# Patient Record
Sex: Female | Born: 1940 | Race: White | Hispanic: No | Smoking: Never smoker
Health system: Southern US, Community
[De-identification: ages and names within clinical notes are randomized; demographics above are authoritative.]

## PROBLEM LIST (undated history)

## (undated) DIAGNOSIS — D510 Vitamin B12 deficiency anemia due to intrinsic factor deficiency: Secondary | ICD-10-CM

## (undated) DIAGNOSIS — I10 Essential (primary) hypertension: Secondary | ICD-10-CM

## (undated) DIAGNOSIS — E079 Disorder of thyroid, unspecified: Secondary | ICD-10-CM

## (undated) DIAGNOSIS — E785 Hyperlipidemia, unspecified: Secondary | ICD-10-CM

## (undated) DIAGNOSIS — R55 Syncope and collapse: Secondary | ICD-10-CM

## (undated) DIAGNOSIS — N390 Urinary tract infection, site not specified: Secondary | ICD-10-CM

## (undated) DIAGNOSIS — I4891 Unspecified atrial fibrillation: Secondary | ICD-10-CM

## (undated) DIAGNOSIS — F419 Anxiety disorder, unspecified: Secondary | ICD-10-CM

## (undated) DIAGNOSIS — M797 Fibromyalgia: Secondary | ICD-10-CM

## (undated) DIAGNOSIS — H409 Unspecified glaucoma: Secondary | ICD-10-CM

## (undated) DIAGNOSIS — R0602 Shortness of breath: Secondary | ICD-10-CM

## (undated) DIAGNOSIS — G5600 Carpal tunnel syndrome, unspecified upper limb: Secondary | ICD-10-CM

## (undated) DIAGNOSIS — E860 Dehydration: Secondary | ICD-10-CM

## (undated) DIAGNOSIS — Z789 Other specified health status: Secondary | ICD-10-CM

## (undated) DIAGNOSIS — I639 Cerebral infarction, unspecified: Secondary | ICD-10-CM

## (undated) DIAGNOSIS — D649 Anemia, unspecified: Secondary | ICD-10-CM

## (undated) DIAGNOSIS — E119 Type 2 diabetes mellitus without complications: Secondary | ICD-10-CM

## (undated) HISTORY — DX: Dehydration: E86.0

## (undated) HISTORY — DX: Unspecified glaucoma: H40.9

## (undated) HISTORY — DX: Anemia, unspecified: D64.9

## (undated) HISTORY — DX: Carpal tunnel syndrome, unspecified upper limb: G56.00

## (undated) HISTORY — DX: Anxiety disorder, unspecified: F41.9

## (undated) HISTORY — DX: Fibromyalgia: M79.7

## (undated) HISTORY — DX: Syncope and collapse: R55

## (undated) HISTORY — DX: Type 2 diabetes mellitus without complications: E11.9

## (undated) HISTORY — DX: Cerebral infarction, unspecified: I63.9

## (undated) HISTORY — DX: Vitamin B12 deficiency anemia due to intrinsic factor deficiency: D51.0

## (undated) HISTORY — DX: Disorder of thyroid, unspecified: E07.9

## (undated) HISTORY — DX: Essential (primary) hypertension: I10

## (undated) HISTORY — DX: Hyperlipidemia, unspecified: E78.5

## (undated) HISTORY — PX: NEUROPLASTY / TRANSPOSITION MEDIAN NERVE AT CARPAL TUNNEL: SUR893

## (undated) HISTORY — DX: Urinary tract infection, site not specified: N39.0

## (undated) HISTORY — DX: Shortness of breath: R06.02

## (undated) HISTORY — PX: APPENDECTOMY: SHX54

## (undated) HISTORY — DX: Unspecified atrial fibrillation: I48.91

---

## 1999-03-06 ENCOUNTER — Inpatient Hospital Stay (HOSPITAL_COMMUNITY)
Admission: RE | Admit: 1999-03-06 | Discharge: 1999-04-03 | Payer: Self-pay | Admitting: Physical Medicine & Rehabilitation

## 1999-05-09 ENCOUNTER — Emergency Department (HOSPITAL_COMMUNITY): Admission: EM | Admit: 1999-05-09 | Discharge: 1999-05-09 | Payer: Self-pay | Admitting: Emergency Medicine

## 2013-08-17 ENCOUNTER — Ambulatory Visit: Payer: Self-pay | Admitting: Podiatrist

## 2013-09-02 ENCOUNTER — Ambulatory Visit (INDEPENDENT_AMBULATORY_CARE_PROVIDER_SITE_OTHER): Payer: Medicare HMO

## 2013-09-02 VITALS — BP 140/70 | HR 75 | Resp 18

## 2013-09-02 DIAGNOSIS — M79609 Pain in unspecified limb: Secondary | ICD-10-CM

## 2013-09-02 DIAGNOSIS — B351 Tinea unguium: Secondary | ICD-10-CM

## 2013-09-02 DIAGNOSIS — L600 Ingrowing nail: Secondary | ICD-10-CM

## 2013-09-02 MED ORDER — CEPHALEXIN 500 MG PO CAPS: 500.0000 mg | ORAL_CAPSULE | Freq: Three times a day (TID) | ORAL | Status: DC

## 2013-09-02 NOTE — Patient Instructions (Signed)
Betadine Soak Instructions  Purchase an 8 oz. bottle of BETADINE solution (Povidone)  THE DAY AFTER THE PROCEDURE  Place 1 tablespoon of betadine solution in a quart of warm tap water.  Submerge your foot or feet with outer bandage intact for the initial soak; this will allow the bandage to become moist and wet for easy lift off.  Once you remove your bandage, continue to soak in the solution for 20 minutes.  This soak should be done twice a day.  Next, remove your foot or feet from solution, blot dry the affected area and cover.  You may use a band aid large enough to cover the area or use gauze and tape.  Apply other medications to the area as directed by the doctor such as cortisporin otic solution (ear drops) or neosporin.  IF YOUR SKIN BECOMES IRRITATED WHILE USING THESE INSTRUCTIONS, IT IS OKAY TO SWITCH TO EPSOM SALTS AND WATER OR WHITE VINEGAR AND WATER. Soak the great toe twice a week for the first week in them once we thereafter until the wound is resolved. 2 Betadine warm water soaks however Betadine caused an irritation may switch to Epsom salts in warm water soaks daily redress with Neosporin or Polysporin and a Band-Aid or gauze dressing.  Alvan Dameichard Syriana Croslin DPM

## 2013-09-02 NOTE — Progress Notes (Signed)
   Subjective:    Patient ID: Diana Brady, female    DOB: [birth year], 73 y.o.   MRN: [id number]  HPI my left big toenail has been thick and curling in and has been that way for years and sore and tender and hurts to wear shoes and the podiatrist that comes to the faculty trimmed on it and part of the nail came out and burns and throbs and my feet get hot and cold and some tingling    Review of Systems  Endocrine: Positive for heat intolerance.  Musculoskeletal: Positive for back pain.  Neurological: Positive for dizziness and headaches.  Hematological: Bruises/bleeds easily.  All other systems reviewed and are negative.      Objective:   Physical Exam Lower extremity objective findings as follows after status is intact with pedal pulses palpable DP plus one over 4 PT plus one over 4 bilateral capillary refill time 3 seconds all digits epicritic and proprioceptive sensations are intact although diminished on Semmes Weinstein testing to the digits and plantar forefoot arch. There is normal plantar response DTRs not listed dermatologic the skin color pigment normal hair growth absent nails criptotic incurvated friable left hallux nail shows yellowing and thickening discoloration and brittleness in kyphosis consistent with onychomycosis and dystrophy of nail possible history of nail injury trauma in the past to there is no discharge drainage no open wounds ulcerations. Orthopedic biomechanical exam rectus foot type ankle mid tarsus subtalar joint motions normal there is slight digital contractures hammertoes 34 and 5 mild bunion deformity noted as well to       Assessment & Plan:  Assessment this time is onychomycosis painful mycotic nail ingrowing thickened dystrophic and brittle one first left at this time the nails been painful and symptomatic for prolonged period of time has had previous temporary debridements hour per patient request my recommendation this time is for nail excision and  phenol matricectomy local anesthetic block is administered total of 3 cc 50-50 mixture of 2% Xylocaine plain and 0.5% Marcaine plain Betadine prep performed the nail plate is avulsed phenol matricectomy followed by Betadine ointment and dry sterile dressing applied to the right great toe patient is given instructions for daily Betadine warm water soaks prescription for cephalexin 500 mg 3 times a day x10 days maintain dressing changes and soaks as instructed with Neosporin and Band-Aid dressing starting tomorrow and DT cannot twice daily for the first week once daily thereafter reappointed 2 weeks for followup and nail check recommended Tylenol as needed for pain  Alvan Dameichard Jaxsen Bernhart DPM

## 2017-01-02 DIAGNOSIS — Z01818 Encounter for other preprocedural examination: Secondary | ICD-10-CM

## 2017-01-24 ENCOUNTER — Ambulatory Visit (INDEPENDENT_AMBULATORY_CARE_PROVIDER_SITE_OTHER): Payer: Medicare HMO | Admitting: Cardiology

## 2017-01-24 ENCOUNTER — Encounter: Payer: Self-pay | Admitting: Cardiology

## 2017-01-24 VITALS — BP 112/78 | HR 60 | Resp 14 | Ht 62.0 in | Wt 206.0 lb

## 2017-01-24 DIAGNOSIS — E785 Hyperlipidemia, unspecified: Secondary | ICD-10-CM | POA: Diagnosis not present

## 2017-01-24 DIAGNOSIS — I482 Chronic atrial fibrillation, unspecified: Secondary | ICD-10-CM | POA: Insufficient documentation

## 2017-01-24 DIAGNOSIS — I6990 Unspecified sequelae of unspecified cerebrovascular disease: Secondary | ICD-10-CM | POA: Diagnosis not present

## 2017-01-24 DIAGNOSIS — Z01818 Encounter for other preprocedural examination: Secondary | ICD-10-CM | POA: Diagnosis not present

## 2017-01-24 DIAGNOSIS — E118 Type 2 diabetes mellitus with unspecified complications: Secondary | ICD-10-CM

## 2017-01-24 NOTE — Progress Notes (Signed)
Cardiology Office Note:    Date:  01/24/2017   ID:  Diana Brady, DOB 1941/04/15, MRN [id number]  PCP:  Shelbie Ammons, MD  Cardiologist:  Gypsy Balsam, MD    Referring MD: Shelbie Ammons, MD   Chief Complaint  Patient presents with  . Follow-up  . Pre-op Exam  she is doingwell from a heart point of view but cannot exercise because of knee problem  History of Present Illness:    Diana Brady is a 76 y.o. female with multiple medical problems, she is here to be evaluated before surgery. Denies having any chest pain tightness squeezing pressure burning in the chest but does have multiple risk factors for coronary artery disease therefore, in spite of the fact that her surgeriesrelatively low risk she needs to be evaluated before the surgery. She will have echocardiogram done as well as stress test. I will ask her to have EKG as well.  Past Medical History:  Diagnosis Date  . Anemia   . Anxiety   . Atrial fibrillation (HCC)   . Carpal tunnel syndrome   . CVA (cerebral infarction)   . Dehydration   . Diabetes mellitus without complication (HCC)   . Fibromyalgia   . Glaucoma   . Hyperlipidemia   . Hypertension   . Pernicious anemia   . Shortness of breath   . Syncope   . Thyroid disease   . UTI (urinary tract infection)     Past Surgical History:  Procedure Laterality Date  . APPENDECTOMY    . NEUROPLASTY / TRANSPOSITION MEDIAN NERVE AT CARPAL TUNNEL      Current Medications: Current Meds  Medication Sig  . atorvastatin (LIPITOR) 40 MG tablet Take 40 mg by mouth daily.  . benazepril (LOTENSIN) 10 MG tablet Take 10 mg by mouth daily.  . budesonide-formoterol (SYMBICORT) 160-4.5 MCG/ACT inhaler Inhale 2 puffs into the lungs 2 (two) times daily.  . cloNIDine (CATAPRES) 0.1 MG tablet Take 0.1 mg by mouth 2 (two) times daily.  . dabigatran (PRADAXA) 75 MG CAPS capsule Take 75 mg by mouth 2 (two) times daily.  . furosemide (LASIX) 20 MG tablet Take 20 mg by  mouth daily.  Marland Kitchen glimepiride (AMARYL) 4 MG tablet Take 4 mg by mouth 2 (two) times daily.  . insulin glargine (LANTUS) 100 UNIT/ML injection Inject into the skin at bedtime.  . insulin NPH Human (HUMULIN N,NOVOLIN N) 100 UNIT/ML injection Inject into the skin.  Marland Kitchen ipratropium-albuterol (DUONEB) 0.5-2.5 (3) MG/3ML SOLN Take 3 mLs by nebulization every 6 (six) hours as needed.  Marland Kitchen levothyroxine (SYNTHROID, LEVOTHROID) 25 MCG tablet Take 25 mcg by mouth daily before breakfast.  . metoprolol succinate (TOPROL-XL) 50 MG 24 hr tablet Take 50 mg by mouth daily. Take with or immediately following a meal.  . montelukast (SINGULAIR) 10 MG tablet Take 10 mg by mouth at bedtime.  Marland Kitchen olopatadine (PATANOL) 0.1 % ophthalmic solution Place 1 drop into both eyes 2 (two) times daily.  . pantoprazole (PROTONIX) 40 MG tablet Take 40 mg by mouth daily.  Marland Kitchen PARoxetine (PAXIL) 40 MG tablet Take 40 mg by mouth every morning.  Marland Kitchen scopolamine (ISOPTO HYOSCINE) 0.25 % ophthalmic solution Place 1 drop into both eyes as needed.  . Tiotropium Bromide Monohydrate (SPIRIVA RESPIMAT IN) Inhale into the lungs.     Allergies:   Codeine; Darvon [propoxyphene]; Dilaudid [hydromorphone hcl]; Propoxyphene; Sulfa antibiotics; Tramadol; and Vancomycin   Social History   Social History  . Marital status: Divorced  Spouse name: N/A  . Number of children: N/A  . Years of education: N/A   Social History Main Topics  . Smoking status: Never Smoker  . Smokeless tobacco: Never Used  . Alcohol use No  . Drug use: No  . Sexual activity: Not Asked   Other Topics Concern  . None   Social History Narrative  . None     Family History: The patient's family history includes Heart disease in her mother. ROS:   Please see the history of present illness.    All 14 point review of systems negative except as described per history of present illness  EKGs/Labs/Other Studies Reviewed:      Recent Labs: No results found for requested  labs within last 8760 hours.  Recent Lipid Panel No results found for: CHOL, TRIG, HDL, CHOLHDL, VLDL, LDLCALC, LDLDIRECT  Physical Exam:    VS:  BP 112/78   Pulse 60   Resp 14   Ht  (1.575 m)   Wt 206 lb (93.4 kg) Comment: Reported, Wheel Chair  BMI 37.68 kg/m     Wt Readings from Last 3 Encounters:  01/24/17 206 lb (93.4 kg)     GEN:  Well nourished, well developed in no acute distress HEENT: Normal NECK: No JVD; No carotid bruits LYMPHATICS: No lymphadenopathy CARDIAC: irregularly irregular, no murmurs, no rubs, no gallops RESPIRATORY:  Clear to auscultation without rales, wheezing or rhonchi  ABDOMEN: Soft, non-tender, non-distended MUSCULOSKELETAL:  No edema; No deformity  SKIN: Warm and dry LOWER EXTREMITIES: no swelling NEUROLOGIC:  Alert and oriented x 3 PSYCHIATRIC:  Normal affect   ASSESSMENT:    1. Chronic atrial fibrillation (HCC)   2. Dyslipidemia   3. Type 2 diabetes mellitus with complication, without long-term current use of insulin (HCC)   4. Late effects of CVA (cerebrovascular accident)    PLAN:    In order of problems listed above:  1. Chronic atrial fibrillation: Rate control, continue anticoagulation. 2. Dyslipidemia:On statin which I will continue. 3. Pre-op evaluation for knee replacement surgery under general or spinal anesthesia I will ask her to have stress test as well as echocardiogram as a part of evaluation. Her ability to exercise is very limited therefore dose tests are needed. 4. Late effects of CVA: Stable   Medication Adjustments/Labs and Tests Ordered: Current medicines are reviewed at length with the patient today.  Concerns regarding medicines are outlined above.  No orders of the defined types were placed in this encounter.  Medication changes: No orders of the defined types were placed in this encounter.   Signed, Georgeanna Lea, MD, [facility] 01/24/2017 4:04 PM    [facility]

## 2017-01-24 NOTE — Patient Instructions (Addendum)
Medication Instructions:  Your physician recommends that you continue on your current medications as directed. Please refer to the Current Medication list given to you today.  Labwork: None   Testing/Procedures: Your physician has requested that you have en exercise stress myoview. For further information please visit [email or web address] Please follow instruction sheet, as given.  Your physician has requested that you have an echocardiogram. Echocardiography is a painless test that uses sound waves to create images of your heart. It provides your doctor with information about the size and shape of your heart and how well your heart's chambers and valves are working. This procedure takes approximately one hour. There are no restrictions for this procedure.  Echocardiogram An echocardiogram, or echocardiography, uses sound waves (ultrasound) to produce an image of your heart. The echocardiogram is simple, painless, obtained within a short period of time, and offers valuable information to your health care provider. The images from an echocardiogram can provide information such as:  Evidence of coronary artery disease (CAD).  Heart size.  Heart muscle function.  Heart valve function.  Aneurysm detection.  Evidence of a past heart attack.  Fluid buildup around the heart.  Heart muscle thickening.  Assess heart valve function.  Tell a health care provider about:  Any allergies you have.  All medicines you are taking, including vitamins, herbs, eye drops, creams, and over-the-counter medicines.  Any problems you or family members have had with anesthetic medicines.  Any blood disorders you have.  Any surgeries you have had.  Any medical conditions you have.  Whether you are pregnant or may be pregnant. What happens before the procedure? No special preparation is needed. Eat and drink normally. What happens during the procedure?  In order to produce an image of your  heart, gel will be applied to your chest and a wand-like tool (transducer) will be moved over your chest. The gel will help transmit the sound waves from the transducer. The sound waves will harmlessly bounce off your heart to allow the heart images to be captured in real-time motion. These images will then be recorded.  You may need an IV to receive a medicine that improves the quality of the pictures. What happens after the procedure? You may return to your normal schedule including diet, activities, and medicines, unless your health care provider tells you otherwise. This information is not intended to replace advice given to you by your health care provider. Make sure you discuss any questions you have with your health care provider. Document Released: 02/25/2000 Document Revised: 12/09/2015 Document Reviewed: 12/28/2012 Elsevier Interactive Patient Education  2017 Elsevier Inc.  Regadenoson injection What is this medicine? REGADENOSON is used to test the heart for coronary artery disease. It is used in patients who can not exercise for their stress test. This medicine may be used for other purposes; ask your health care provider or pharmacist if you have questions. COMMON BRAND NAME(S): Lexiscan What should I tell my health care provider before I take this medicine? They need to know if you have any of these conditions: -heart problems -lung or breathing disease, like asthma or COPD -an unusual or allergic reaction to regadenoson, other medicines, foods, dyes, or preservatives -pregnant or trying to get pregnant -breast-feeding How should I use this medicine? This medicine is for injection into a vein. It is given by a health care professional in a hospital or clinic setting. Talk to your pediatrician regarding the use of this medicine in children. Special care may be needed.  Overdosage: If you think you have taken too much of this medicine contact a poison control center or emergency  room at once. NOTE: This medicine is only for you. Do not share this medicine with others. What if I miss a dose? This does not apply. What may interact with this medicine? -caffeine -dipyridamole -guarana -theophylline This list may not describe all possible interactions. Give your health care provider a list of all the medicines, herbs, non-prescription drugs, or dietary supplements you use. Also tell them if you smoke, drink alcohol, or use illegal drugs. Some items may interact with your medicine. What should I watch for while using this medicine? Your condition will be monitored carefully while you are receiving this medicine. Do not take medicines, foods, or drinks with caffeine (like coffee, tea, or colas) for at least 12 hours before your test. If you do not know if something contains caffeine, ask your health care professional. What side effects may I notice from receiving this medicine? Side effects that you should report to your doctor or health care professional as soon as possible: -allergic reactions like skin rash, itching or hives, swelling of the face, lips, or tongue -breathing problems -chest pain, tightness or palpitations -severe headache Side effects that usually do not require medical attention (report to your doctor or health care professional if they continue or are bothersome): -flushing -headache -irritation or pain at site where injected -nausea, vomiting This list may not describe all possible side effects. Call your doctor for medical advice about side effects. You may report side effects to FDA at 1-800-FDA-1088. Where should I keep my medicine? This drug is given in a hospital or clinic and will not be stored at home. NOTE: This sheet is a summary. It may not cover all possible information. If you have questions about this medicine, talk to your doctor, pharmacist, or health care provider.  2018 Elsevier/Gold Standard (2007-12-21  15:08:13)    Follow-Up: Your physician wants you to follow-up in: 6 months.  You will receive a reminder letter in the mail two months in advance. If you don't receive a letter, please call our office to schedule the follow-up appointment.  Any Other Special Instructions Will Be Listed Below (If Applicable).  Please note that any paperwork needing to be filled out by the provider will need to be addressed at the front desk prior to seeing the provider. Please note that any paperwork FMLA, Disability or other documents regarding health condition is subject to a $25.00 charge that must be received prior to completion of paperwork in the form of a money order or check.    If you need a refill on your cardiac medications before your next appointment, please call your pharmacy.

## 2017-02-04 ENCOUNTER — Telehealth (HOSPITAL_COMMUNITY): Payer: Self-pay | Admitting: *Deleted

## 2017-02-04 NOTE — Telephone Encounter (Signed)
Attempted to call patient regarding upcoming appointment- no answer.  Diana Brady Jacqueline  

## 2017-02-06 ENCOUNTER — Ambulatory Visit (HOSPITAL_COMMUNITY): Payer: Medicare HMO | Attending: Cardiology

## 2017-02-06 ENCOUNTER — Other Ambulatory Visit: Payer: Self-pay

## 2017-02-06 ENCOUNTER — Ambulatory Visit (HOSPITAL_BASED_OUTPATIENT_CLINIC_OR_DEPARTMENT_OTHER): Payer: Medicare HMO

## 2017-02-06 DIAGNOSIS — Z01818 Encounter for other preprocedural examination: Secondary | ICD-10-CM | POA: Diagnosis not present

## 2017-02-06 DIAGNOSIS — I482 Chronic atrial fibrillation, unspecified: Secondary | ICD-10-CM

## 2017-02-06 DIAGNOSIS — D649 Anemia, unspecified: Secondary | ICD-10-CM | POA: Diagnosis not present

## 2017-02-06 DIAGNOSIS — R0609 Other forms of dyspnea: Secondary | ICD-10-CM | POA: Insufficient documentation

## 2017-02-06 DIAGNOSIS — Z87440 Personal history of urinary (tract) infections: Secondary | ICD-10-CM | POA: Diagnosis not present

## 2017-02-06 DIAGNOSIS — Z8673 Personal history of transient ischemic attack (TIA), and cerebral infarction without residual deficits: Secondary | ICD-10-CM | POA: Diagnosis not present

## 2017-02-06 DIAGNOSIS — F419 Anxiety disorder, unspecified: Secondary | ICD-10-CM | POA: Insufficient documentation

## 2017-02-06 DIAGNOSIS — E785 Hyperlipidemia, unspecified: Secondary | ICD-10-CM | POA: Diagnosis not present

## 2017-02-06 DIAGNOSIS — E119 Type 2 diabetes mellitus without complications: Secondary | ICD-10-CM | POA: Insufficient documentation

## 2017-02-06 DIAGNOSIS — E079 Disorder of thyroid, unspecified: Secondary | ICD-10-CM | POA: Insufficient documentation

## 2017-02-06 DIAGNOSIS — I10 Essential (primary) hypertension: Secondary | ICD-10-CM | POA: Diagnosis not present

## 2017-02-06 DIAGNOSIS — I081 Rheumatic disorders of both mitral and tricuspid valves: Secondary | ICD-10-CM | POA: Diagnosis not present

## 2017-02-06 LAB — MYOCARDIAL PERFUSION IMAGING
CHL CUP NUCLEAR SDS: 0
CHL CUP RESTING HR STRESS: 81 {beats}/min
LHR: 0.31
LVDIAVOL: 80 mL (ref 46–106)
LVSYSVOL: 29 mL
NUC STRESS TID: 0.96
Peak HR: 98 {beats}/min
SRS: 2
SSS: 2

## 2017-02-06 LAB — ECHOCARDIOGRAM COMPLETE
HEIGHTINCHES: 62 in
WEIGHTICAEL: 3296 [oz_av]

## 2017-02-06 MED ORDER — PERFLUTREN LIPID MICROSPHERE
1.0000 mL | INTRAVENOUS | Status: AC | PRN
  Administered 2017-02-06: 2 mL via INTRAVENOUS

## 2017-02-06 MED ORDER — TECHNETIUM TC 99M TETROFOSMIN IV KIT
10.2000 | PACK | Freq: Once | INTRAVENOUS | Status: AC | PRN
  Administered 2017-02-06: 10.2 via INTRAVENOUS
  Filled 2017-02-06: qty 11

## 2017-02-06 MED ORDER — REGADENOSON 0.4 MG/5ML IV SOLN
0.4000 mg | Freq: Once | INTRAVENOUS | Status: AC
  Administered 2017-02-06: 0.4 mg via INTRAVENOUS

## 2017-02-06 MED ORDER — TECHNETIUM TC 99M TETROFOSMIN IV KIT
31.5000 | PACK | Freq: Once | INTRAVENOUS | Status: AC | PRN
  Administered 2017-02-06: 31.5 via INTRAVENOUS
  Filled 2017-02-06: qty 32

## 2018-06-07 IMAGING — NM NM MISC PROCEDURE
6 series · 36 of 36 positions shown · non-contrast
Comparison: none

[Series 1: wbr_r-proj_st rest · 6.40mm/px · 6 of 64 frames shown]
[frame 6/64]
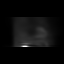
[frame 16/64]
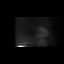
[frame 27/64]
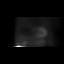
[frame 38/64]
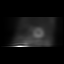
[frame 48/64]
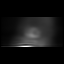
[frame 59/64]
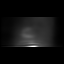

[Series 1: rest · 6.40mm/px · 6 of 64 frames shown]
[frame 6/64]
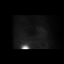
[frame 16/64]
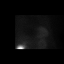
[frame 27/64]
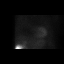
[frame 38/64]
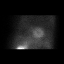
[frame 48/64]
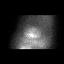
[frame 59/64]
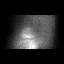

[Series 1: wbr_s-proj_st stress-sum-em · 6.40mm/px · 6 of 64 frames shown]
[frame 6/64]
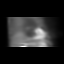
[frame 16/64]
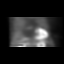
[frame 27/64]
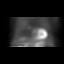
[frame 38/64]
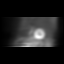
[frame 48/64]
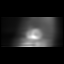
[frame 59/64]
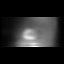

[Series 1: stress-gsp · 6.40mm/px · 6 of 510 frames shown]
[frame 43/510]
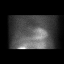
[frame 128/510]
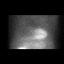
[frame 213/510]
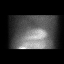
[frame 298/510]
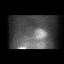
[frame 383/510]
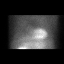
[frame 468/510]
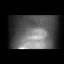

[Series 1: wbr_s-proj_st stress-gsp · 6.40mm/px · 6 of 512 frames shown]
[frame 43/512]
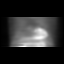
[frame 128/512]
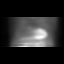
[frame 214/512]
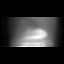
[frame 299/512]
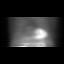
[frame 384/512]
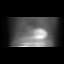
[frame 470/512]
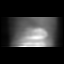

[Series 1: stress-sum-em · 6.40mm/px · 6 of 64 frames shown]
[frame 6/64]
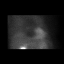
[frame 16/64]
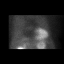
[frame 27/64]
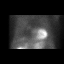
[frame 38/64]
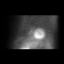
[frame 48/64]
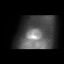
[frame 59/64]
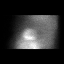

[36 of 36 positions shown; findings below may reference images not displayed]

Canned report from images found in remote index.

Refer to host system for actual result text.

## 2018-06-12 DIAGNOSIS — I110 Hypertensive heart disease with heart failure: Secondary | ICD-10-CM

## 2018-06-12 DIAGNOSIS — J9601 Acute respiratory failure with hypoxia: Secondary | ICD-10-CM

## 2018-06-12 DIAGNOSIS — R0602 Shortness of breath: Secondary | ICD-10-CM

## 2018-06-12 DIAGNOSIS — I482 Chronic atrial fibrillation, unspecified: Secondary | ICD-10-CM

## 2018-06-12 DIAGNOSIS — I5033 Acute on chronic diastolic (congestive) heart failure: Secondary | ICD-10-CM

## 2018-06-13 DIAGNOSIS — I361 Nonrheumatic tricuspid (valve) insufficiency: Secondary | ICD-10-CM

## 2018-06-13 DIAGNOSIS — J9601 Acute respiratory failure with hypoxia: Secondary | ICD-10-CM | POA: Diagnosis not present

## 2018-06-13 DIAGNOSIS — I272 Pulmonary hypertension, unspecified: Secondary | ICD-10-CM

## 2018-06-13 DIAGNOSIS — I340 Nonrheumatic mitral (valve) insufficiency: Secondary | ICD-10-CM | POA: Diagnosis not present

## 2018-06-13 DIAGNOSIS — R0602 Shortness of breath: Secondary | ICD-10-CM | POA: Diagnosis not present

## 2018-06-13 DIAGNOSIS — I110 Hypertensive heart disease with heart failure: Secondary | ICD-10-CM | POA: Diagnosis not present

## 2018-06-13 DIAGNOSIS — I482 Chronic atrial fibrillation, unspecified: Secondary | ICD-10-CM | POA: Diagnosis not present

## 2018-06-14 DIAGNOSIS — J9601 Acute respiratory failure with hypoxia: Secondary | ICD-10-CM | POA: Diagnosis not present

## 2018-06-14 DIAGNOSIS — R0602 Shortness of breath: Secondary | ICD-10-CM | POA: Diagnosis not present

## 2018-06-14 DIAGNOSIS — I110 Hypertensive heart disease with heart failure: Secondary | ICD-10-CM | POA: Diagnosis not present

## 2018-06-14 DIAGNOSIS — I482 Chronic atrial fibrillation, unspecified: Secondary | ICD-10-CM | POA: Diagnosis not present

## 2018-06-15 DIAGNOSIS — I482 Chronic atrial fibrillation, unspecified: Secondary | ICD-10-CM | POA: Diagnosis not present

## 2018-06-15 DIAGNOSIS — J9601 Acute respiratory failure with hypoxia: Secondary | ICD-10-CM | POA: Diagnosis not present

## 2018-06-15 DIAGNOSIS — I110 Hypertensive heart disease with heart failure: Secondary | ICD-10-CM | POA: Diagnosis not present

## 2018-06-15 DIAGNOSIS — R0602 Shortness of breath: Secondary | ICD-10-CM | POA: Diagnosis not present

## 2019-01-29 DIAGNOSIS — R0902 Hypoxemia: Secondary | ICD-10-CM

## 2019-01-29 DIAGNOSIS — I10 Essential (primary) hypertension: Secondary | ICD-10-CM

## 2019-01-29 DIAGNOSIS — K219 Gastro-esophageal reflux disease without esophagitis: Secondary | ICD-10-CM

## 2019-01-29 DIAGNOSIS — E669 Obesity, unspecified: Secondary | ICD-10-CM

## 2019-01-29 DIAGNOSIS — I509 Heart failure, unspecified: Secondary | ICD-10-CM

## 2019-01-29 DIAGNOSIS — N179 Acute kidney failure, unspecified: Secondary | ICD-10-CM

## 2019-01-29 DIAGNOSIS — E039 Hypothyroidism, unspecified: Secondary | ICD-10-CM

## 2019-01-29 DIAGNOSIS — I4891 Unspecified atrial fibrillation: Secondary | ICD-10-CM

## 2019-01-29 DIAGNOSIS — R0602 Shortness of breath: Secondary | ICD-10-CM | POA: Diagnosis not present

## 2019-01-29 DIAGNOSIS — J441 Chronic obstructive pulmonary disease with (acute) exacerbation: Secondary | ICD-10-CM

## 2019-01-29 DIAGNOSIS — R739 Hyperglycemia, unspecified: Secondary | ICD-10-CM

## 2019-01-30 DIAGNOSIS — R0902 Hypoxemia: Secondary | ICD-10-CM | POA: Diagnosis not present

## 2019-01-30 DIAGNOSIS — I340 Nonrheumatic mitral (valve) insufficiency: Secondary | ICD-10-CM

## 2019-01-30 DIAGNOSIS — I4891 Unspecified atrial fibrillation: Secondary | ICD-10-CM | POA: Diagnosis not present

## 2019-01-30 DIAGNOSIS — J441 Chronic obstructive pulmonary disease with (acute) exacerbation: Secondary | ICD-10-CM | POA: Diagnosis not present

## 2019-01-30 DIAGNOSIS — I361 Nonrheumatic tricuspid (valve) insufficiency: Secondary | ICD-10-CM | POA: Diagnosis not present

## 2019-01-30 DIAGNOSIS — N179 Acute kidney failure, unspecified: Secondary | ICD-10-CM | POA: Diagnosis not present

## 2019-01-31 DIAGNOSIS — N179 Acute kidney failure, unspecified: Secondary | ICD-10-CM | POA: Diagnosis not present

## 2019-01-31 DIAGNOSIS — J441 Chronic obstructive pulmonary disease with (acute) exacerbation: Secondary | ICD-10-CM | POA: Diagnosis not present

## 2019-01-31 DIAGNOSIS — I4891 Unspecified atrial fibrillation: Secondary | ICD-10-CM | POA: Diagnosis not present

## 2019-01-31 DIAGNOSIS — R0902 Hypoxemia: Secondary | ICD-10-CM | POA: Diagnosis not present

## 2019-02-01 DIAGNOSIS — R0902 Hypoxemia: Secondary | ICD-10-CM | POA: Diagnosis not present

## 2019-02-01 DIAGNOSIS — I4891 Unspecified atrial fibrillation: Secondary | ICD-10-CM | POA: Diagnosis not present

## 2019-02-01 DIAGNOSIS — J441 Chronic obstructive pulmonary disease with (acute) exacerbation: Secondary | ICD-10-CM | POA: Diagnosis not present

## 2019-02-01 DIAGNOSIS — N179 Acute kidney failure, unspecified: Secondary | ICD-10-CM | POA: Diagnosis not present

## 2019-04-15 ENCOUNTER — Inpatient Hospital Stay
Admission: AD | Admit: 2019-04-15 | Payer: Medicare HMO | Source: Other Acute Inpatient Hospital | Admitting: Internal Medicine

## 2019-04-16 ENCOUNTER — Encounter (HOSPITAL_COMMUNITY): Payer: Self-pay | Admitting: Internal Medicine

## 2019-04-16 ENCOUNTER — Inpatient Hospital Stay (HOSPITAL_COMMUNITY)
Admission: AD | Admit: 2019-04-16 | Discharge: 2019-05-07 | DRG: 208 | Disposition: E | Payer: Medicare HMO | Source: Other Acute Inpatient Hospital | Attending: Internal Medicine | Admitting: Internal Medicine

## 2019-04-16 ENCOUNTER — Inpatient Hospital Stay (HOSPITAL_COMMUNITY): Payer: Medicare HMO

## 2019-04-16 DIAGNOSIS — Z23 Encounter for immunization: Secondary | ICD-10-CM

## 2019-04-16 DIAGNOSIS — E118 Type 2 diabetes mellitus with unspecified complications: Secondary | ICD-10-CM | POA: Diagnosis not present

## 2019-04-16 DIAGNOSIS — E785 Hyperlipidemia, unspecified: Secondary | ICD-10-CM

## 2019-04-16 DIAGNOSIS — F419 Anxiety disorder, unspecified: Secondary | ICD-10-CM | POA: Diagnosis present

## 2019-04-16 DIAGNOSIS — D510 Vitamin B12 deficiency anemia due to intrinsic factor deficiency: Secondary | ICD-10-CM | POA: Diagnosis present

## 2019-04-16 DIAGNOSIS — M797 Fibromyalgia: Secondary | ICD-10-CM | POA: Diagnosis present

## 2019-04-16 DIAGNOSIS — J9621 Acute and chronic respiratory failure with hypoxia: Secondary | ICD-10-CM | POA: Diagnosis present

## 2019-04-16 DIAGNOSIS — Z8673 Personal history of transient ischemic attack (TIA), and cerebral infarction without residual deficits: Secondary | ICD-10-CM

## 2019-04-16 DIAGNOSIS — Z7901 Long term (current) use of anticoagulants: Secondary | ICD-10-CM

## 2019-04-16 DIAGNOSIS — H409 Unspecified glaucoma: Secondary | ICD-10-CM | POA: Diagnosis present

## 2019-04-16 DIAGNOSIS — Z7951 Long term (current) use of inhaled steroids: Secondary | ICD-10-CM

## 2019-04-16 DIAGNOSIS — J80 Acute respiratory distress syndrome: Secondary | ICD-10-CM

## 2019-04-16 DIAGNOSIS — I4891 Unspecified atrial fibrillation: Secondary | ICD-10-CM | POA: Diagnosis present

## 2019-04-16 DIAGNOSIS — E079 Disorder of thyroid, unspecified: Secondary | ICD-10-CM | POA: Diagnosis present

## 2019-04-16 DIAGNOSIS — J440 Chronic obstructive pulmonary disease with acute lower respiratory infection: Secondary | ICD-10-CM | POA: Diagnosis present

## 2019-04-16 DIAGNOSIS — R5381 Other malaise: Secondary | ICD-10-CM | POA: Diagnosis present

## 2019-04-16 DIAGNOSIS — E876 Hypokalemia: Secondary | ICD-10-CM | POA: Diagnosis not present

## 2019-04-16 DIAGNOSIS — Z95828 Presence of other vascular implants and grafts: Secondary | ICD-10-CM

## 2019-04-16 DIAGNOSIS — J1289 Other viral pneumonia: Secondary | ICD-10-CM | POA: Diagnosis present

## 2019-04-16 DIAGNOSIS — Z794 Long term (current) use of insulin: Secondary | ICD-10-CM | POA: Diagnosis not present

## 2019-04-16 DIAGNOSIS — Z9981 Dependence on supplemental oxygen: Secondary | ICD-10-CM | POA: Diagnosis not present

## 2019-04-16 DIAGNOSIS — Z79899 Other long term (current) drug therapy: Secondary | ICD-10-CM

## 2019-04-16 DIAGNOSIS — U071 COVID-19: Secondary | ICD-10-CM | POA: Diagnosis present

## 2019-04-16 DIAGNOSIS — J189 Pneumonia, unspecified organism: Secondary | ICD-10-CM

## 2019-04-16 DIAGNOSIS — E1165 Type 2 diabetes mellitus with hyperglycemia: Secondary | ICD-10-CM | POA: Diagnosis present

## 2019-04-16 DIAGNOSIS — I469 Cardiac arrest, cause unspecified: Secondary | ICD-10-CM | POA: Diagnosis not present

## 2019-04-16 DIAGNOSIS — F0390 Unspecified dementia without behavioral disturbance: Secondary | ICD-10-CM | POA: Diagnosis present

## 2019-04-16 DIAGNOSIS — I10 Essential (primary) hypertension: Secondary | ICD-10-CM | POA: Diagnosis present

## 2019-04-16 DIAGNOSIS — Z452 Encounter for adjustment and management of vascular access device: Secondary | ICD-10-CM

## 2019-04-16 DIAGNOSIS — I480 Paroxysmal atrial fibrillation: Secondary | ICD-10-CM | POA: Diagnosis present

## 2019-04-16 DIAGNOSIS — Z79890 Hormone replacement therapy (postmenopausal): Secondary | ICD-10-CM

## 2019-04-16 DIAGNOSIS — Z8249 Family history of ischemic heart disease and other diseases of the circulatory system: Secondary | ICD-10-CM

## 2019-04-16 HISTORY — DX: Other specified health status: Z78.9

## 2019-04-16 LAB — CBC WITH DIFFERENTIAL/PLATELET
Abs Immature Granulocytes: 0.1 10*3/uL — ABNORMAL HIGH (ref 0.00–0.07)
Basophils Absolute: 0 10*3/uL (ref 0.0–0.1)
Basophils Relative: 0 %
Eosinophils Absolute: 0 10*3/uL (ref 0.0–0.5)
Eosinophils Relative: 0 %
HCT: 31.8 % — ABNORMAL LOW (ref 36.0–46.0)
Hemoglobin: 9.7 g/dL — ABNORMAL LOW (ref 12.0–15.0)
Immature Granulocytes: 1 %
Lymphocytes Relative: 3 %
Lymphs Abs: 0.3 10*3/uL — ABNORMAL LOW (ref 0.7–4.0)
MCH: 24.9 pg — ABNORMAL LOW (ref 26.0–34.0)
MCHC: 30.5 g/dL (ref 30.0–36.0)
MCV: 81.7 fL (ref 80.0–100.0)
Monocytes Absolute: 0.4 10*3/uL (ref 0.1–1.0)
Monocytes Relative: 4 %
Neutro Abs: 8.7 10*3/uL — ABNORMAL HIGH (ref 1.7–7.7)
Neutrophils Relative %: 92 %
Platelets: 133 10*3/uL — ABNORMAL LOW (ref 150–400)
RBC: 3.89 MIL/uL (ref 3.87–5.11)
RDW: 15.9 % — ABNORMAL HIGH (ref 11.5–15.5)
WBC: 9.4 10*3/uL (ref 4.0–10.5)
nRBC: 0 % (ref 0.0–0.2)

## 2019-04-16 LAB — COMPREHENSIVE METABOLIC PANEL
ALT: 14 U/L (ref 0–44)
AST: 24 U/L (ref 15–41)
Albumin: 2.8 g/dL — ABNORMAL LOW (ref 3.5–5.0)
Alkaline Phosphatase: 63 U/L (ref 38–126)
Anion gap: 15 (ref 5–15)
BUN: 26 mg/dL — ABNORMAL HIGH (ref 8–23)
CO2: 24 mmol/L (ref 22–32)
Calcium: 8 mg/dL — ABNORMAL LOW (ref 8.9–10.3)
Chloride: 96 mmol/L — ABNORMAL LOW (ref 98–111)
Creatinine, Ser: 1 mg/dL (ref 0.44–1.00)
GFR calc Af Amer: >60 mL/min (ref >60)
GFR calc non Af Amer: 54 mL/min — ABNORMAL LOW (ref >60)
Glucose, Bld: 277 mg/dL — ABNORMAL HIGH (ref 70–99)
Potassium: 3.2 mmol/L — ABNORMAL LOW (ref 3.5–5.1)
Sodium: 135 mmol/L (ref 135–145)
Total Bilirubin: 1.2 mg/dL (ref 0.3–1.2)
Total Protein: 6.1 g/dL — ABNORMAL LOW (ref 6.5–8.1)

## 2019-04-16 LAB — D-DIMER, QUANTITATIVE: D-Dimer, Quant: 2.37 ug/mL-FEU — ABNORMAL HIGH (ref 0.00–0.50)

## 2019-04-16 LAB — GLUCOSE, CAPILLARY
Glucose-Capillary: 274 mg/dL — ABNORMAL HIGH (ref 70–99)
Glucose-Capillary: 279 mg/dL — ABNORMAL HIGH (ref 70–99)
Glucose-Capillary: 347 mg/dL — ABNORMAL HIGH (ref 70–99)
Glucose-Capillary: 321 mg/dL — ABNORMAL HIGH (ref 70–99)

## 2019-04-16 LAB — TYPE AND SCREEN
ABO/RH(D): AB POS
Antibody Screen: NEGATIVE

## 2019-04-16 LAB — FERRITIN: Ferritin: 1360 ng/mL — ABNORMAL HIGH (ref 11–307)

## 2019-04-16 LAB — PROCALCITONIN: Procalcitonin: 0.17 ng/mL

## 2019-04-16 LAB — SAMPLE TO BLOOD BANK

## 2019-04-16 LAB — BRAIN NATRIURETIC PEPTIDE: B Natriuretic Peptide: 264.9 pg/mL — ABNORMAL HIGH (ref 0.0–100.0)

## 2019-04-16 LAB — HEMOGLOBIN A1C
Hgb A1c MFr Bld: 8.1 % — ABNORMAL HIGH (ref 4.8–5.6)
Mean Plasma Glucose: 185.77 mg/dL

## 2019-04-16 LAB — C-REACTIVE PROTEIN: CRP: 21.3 mg/dL — ABNORMAL HIGH (ref <1.0)

## 2019-04-16 MED ORDER — ACETAMINOPHEN 325 MG PO TABS: 650.0000 mg | ORAL_TABLET | Freq: Four times a day (QID) | ORAL | Status: DC | PRN

## 2019-04-16 MED ORDER — FUROSEMIDE 10 MG/ML IJ SOLN
40.0000 mg | Freq: Once | INTRAMUSCULAR | Status: AC
  Administered 2019-04-16: 40 mg via INTRAVENOUS
  Filled 2019-04-16: qty 4

## 2019-04-16 MED ORDER — INSULIN ASPART 100 UNIT/ML ~~LOC~~ SOLN
0.0000 [IU] | Freq: Three times a day (TID) | SUBCUTANEOUS | Status: DC
  Administered 2019-04-16 (×2): 11 [IU] via SUBCUTANEOUS
  Administered 2019-04-16: 15 [IU] via SUBCUTANEOUS
  Administered 2019-04-17: 08:00:00 7 [IU] via SUBCUTANEOUS
  Administered 2019-04-17: 17:00:00 20 [IU] via SUBCUTANEOUS
  Administered 2019-04-17: 12:00:00 7 [IU] via SUBCUTANEOUS

## 2019-04-16 MED ORDER — ENOXAPARIN SODIUM 40 MG/0.4ML ~~LOC~~ SOLN: 40.0000 mg | SUBCUTANEOUS | Status: DC

## 2019-04-16 MED ORDER — DABIGATRAN ETEXILATE MESYLATE 75 MG PO CAPS
75.0000 mg | ORAL_CAPSULE | Freq: Two times a day (BID) | ORAL | Status: DC
  Administered 2019-04-16 – 2019-04-18 (×5): 75 mg via ORAL
  Filled 2019-04-16 (×6): qty 1

## 2019-04-16 MED ORDER — POTASSIUM CHLORIDE 10 MEQ/100ML IV SOLN
10.0000 meq | INTRAVENOUS | Status: AC
  Administered 2019-04-16 (×4): 10 meq via INTRAVENOUS
  Filled 2019-04-16 (×3): qty 100

## 2019-04-16 MED ORDER — DOCUSATE SODIUM 100 MG PO CAPS
100.0000 mg | ORAL_CAPSULE | Freq: Every day | ORAL | Status: DC
  Administered 2019-04-16 – 2019-04-19 (×4): 100 mg via ORAL
  Filled 2019-04-16 (×4): qty 1

## 2019-04-16 MED ORDER — HYDROCOD POLST-CPM POLST ER 10-8 MG/5ML PO SUER
5.0000 mL | Freq: Two times a day (BID) | ORAL | Status: DC | PRN
  Administered 2019-04-18 (×2): 5 mL via ORAL
  Filled 2019-04-16 (×2): qty 5

## 2019-04-16 MED ORDER — ZINC SULFATE 220 (50 ZN) MG PO CAPS
220.0000 mg | ORAL_CAPSULE | Freq: Every day | ORAL | Status: DC
  Administered 2019-04-16 – 2019-04-19 (×4): 220 mg via ORAL
  Filled 2019-04-16 (×4): qty 1

## 2019-04-16 MED ORDER — GUAIFENESIN-DM 100-10 MG/5ML PO SYRP
10.0000 mL | ORAL_SOLUTION | ORAL | Status: DC | PRN
  Administered 2019-04-18: 21:00:00 10 mL via ORAL
  Filled 2019-04-16: qty 10

## 2019-04-16 MED ORDER — IPRATROPIUM-ALBUTEROL 20-100 MCG/ACT IN AERS
1.0000 | INHALATION_SPRAY | Freq: Four times a day (QID) | RESPIRATORY_TRACT | Status: DC
  Administered 2019-04-16 – 2019-04-19 (×15): 1 via RESPIRATORY_TRACT
  Filled 2019-04-16 (×2): qty 4

## 2019-04-16 MED ORDER — INSULIN DETEMIR 100 UNIT/ML ~~LOC~~ SOLN
10.0000 [IU] | Freq: Two times a day (BID) | SUBCUTANEOUS | Status: DC
  Administered 2019-04-16 – 2019-04-17 (×3): 10 [IU] via SUBCUTANEOUS
  Filled 2019-04-16 (×3): qty 0.1

## 2019-04-16 MED ORDER — SODIUM CHLORIDE 0.9 % IV SOLN: 200.0000 mg | Freq: Once | INTRAVENOUS | Status: DC

## 2019-04-16 MED ORDER — SODIUM CHLORIDE 0.9% IV SOLUTION: Freq: Once | INTRAVENOUS | Status: DC

## 2019-04-16 MED ORDER — TOCILIZUMAB 400 MG/20ML IV SOLN
600.0000 mg | Freq: Once | INTRAVENOUS | Status: AC
  Administered 2019-04-16: 600 mg via INTRAVENOUS
  Filled 2019-04-16: qty 10

## 2019-04-16 MED ORDER — INSULIN ASPART 100 UNIT/ML ~~LOC~~ SOLN
0.0000 [IU] | Freq: Every day | SUBCUTANEOUS | Status: DC
  Administered 2019-04-16: 3 [IU] via SUBCUTANEOUS

## 2019-04-16 MED ORDER — ONDANSETRON HCL 4 MG/2ML IJ SOLN
4.0000 mg | Freq: Four times a day (QID) | INTRAMUSCULAR | Status: DC | PRN
  Administered 2019-04-16: 4 mg via INTRAVENOUS
  Filled 2019-04-16: qty 2

## 2019-04-16 MED ORDER — ASCORBIC ACID 500 MG PO TABS
500.0000 mg | ORAL_TABLET | Freq: Every day | ORAL | Status: DC
  Administered 2019-04-16 – 2019-04-19 (×4): 500 mg via ORAL
  Filled 2019-04-16 (×3): qty 1

## 2019-04-16 MED ORDER — SODIUM CHLORIDE 0.9 % IV SOLN: 100.0000 mg | Freq: Every day | INTRAVENOUS | Status: DC

## 2019-04-16 MED ORDER — SODIUM CHLORIDE 0.9 % IV SOLN
100.0000 mg | Freq: Every day | INTRAVENOUS | Status: AC
  Administered 2019-04-16 – 2019-04-19 (×4): 100 mg via INTRAVENOUS
  Filled 2019-04-16 (×4): qty 20

## 2019-04-16 MED ORDER — POTASSIUM CHLORIDE CRYS ER 20 MEQ PO TBCR: 40.0000 meq | EXTENDED_RELEASE_TABLET | Freq: Two times a day (BID) | ORAL | Status: DC

## 2019-04-16 MED ORDER — NYSTATIN 100000 UNIT/GM EX POWD
Freq: Three times a day (TID) | CUTANEOUS | Status: DC
  Administered 2019-04-16 – 2019-04-19 (×12): via TOPICAL
  Filled 2019-04-16 (×2): qty 15

## 2019-04-16 MED ORDER — SODIUM CHLORIDE 0.9 % IV SOLN
100.0000 mg | Freq: Two times a day (BID) | INTRAVENOUS | Status: DC
  Administered 2019-04-16 – 2019-04-18 (×5): 100 mg via INTRAVENOUS
  Filled 2019-04-16 (×8): qty 100

## 2019-04-16 MED ORDER — DEXAMETHASONE 6 MG PO TABS
6.0000 mg | ORAL_TABLET | ORAL | Status: DC
  Administered 2019-04-16 – 2019-04-19 (×4): 6 mg via ORAL
  Filled 2019-04-16 (×5): qty 1

## 2019-04-16 NOTE — Evaluation (Signed)
Physical Therapy Evaluation Patient Details Name: Diana Brady MRN: [id number] DOB: 01/07/1941 Today's Date: 04/15/2019   History of Present Illness  78 year old female with history of COPD on 2 L oxygen at home, diabetes mellitus, chronic debility who lives in an assisted living facility presented to the [facility] emergency room after testing positive for COVID-19 on 04/11/2019 with increasing shortness of breath and fatigue. Repeat COVID-19 was positive.  She was admitted to [facility] to treat for COVID-19 related pneumonia.  Clinical Impression   Pt admitted with above diagnosis. PTA was living in ALF, she states she was able to ambulate with RW but staff were present to assist with any and all ADLs as needed. Pt currently with functional limitations due to the deficits listed below (see PT Problem List). This pm pt is quite fatigued, she is on 15L/min via HFNC and also 15L/min via NRB to maintain sats in 90s, which she does with the exception of bed mob scooting back into place desat to 88%. Pt was able to get to EOB with mod a and cues and sits unsupported EOB for approx , cues for breathing needed. Able to stand with mod a and side step x 2-3 steps to scoot up in bed.  Pt will benefit from skilled PT to increase her overall strength, balance and coordination, activity tolerance, independence and safety with mobility to allow discharge to the venue listed below.       Follow Up Recommendations SNF    Equipment Recommendations  None recommended by PT    Recommendations for Other Services       Precautions / Restrictions Precautions Precautions: Fall Precaution Comments: 02 sats monitor Restrictions Weight Bearing Restrictions: No      Mobility  Bed Mobility Overal bed mobility: Needs Assistance Bed Mobility: Rolling;Supine to Sit;Sit to Supine Rolling: Min assist   Supine to sit: Mod assist Sit to supine: Mod assist   General bed mobility comments: needs  bed trended to scoot up in bed, needs HOB elevated   Transfers Overall transfer level: Needs assistance Equipment used: Rolling walker (2 wheeled) Transfers: Sit to/from Stand Sit to Stand: Mod assist            Ambulation/Gait             General Gait Details: was able to take 2-3 steps to right at EOB to move up in bed w/ RW and min a and cues  Stairs            Wheelchair Mobility    Modified Rankin (Stroke Patients Only)       Balance Overall balance assessment: Mild deficits observed, not formally tested                                           Pertinent Vitals/Pain Pain Assessment: Faces Faces Pain Scale: Hurts little more Pain Location: w/ mobility, breathing seems to cause some discomfort Pain Descriptors / Indicators: Discomfort Pain Intervention(s): Limited activity within patient's tolerance;Monitored during session    Home Living Family/patient expects to be discharged to:: Assisted living               Home Equipment: Walker - 2 wheels Additional Comments: states has assistance for meals, bathing, dressing, staff assist with all needs    Prior Function Level of Independence: Needs assistance   Gait / Transfers Assistance Needed: ambulated  with RW and transfers w/ AD  ADL's / Homemaking Assistance Needed: assited by staff at home        Hand Dominance        Extremity/Trunk Assessment   Upper Extremity Assessment Upper Extremity Assessment: Generalized weakness    Lower Extremity Assessment Lower Extremity Assessment: Generalized weakness       Communication   Communication: No difficulties(gets winded fast)  Cognition Arousal/Alertness: Lethargic Behavior During Therapy: WFL for tasks assessed/performed Overall Cognitive Status: No family/caregiver present to determine baseline cognitive functioning                                 General Comments: seems to be close to baseline       General Comments      Exercises     Assessment/Plan    PT Assessment Patient needs continued PT services  PT Problem List Decreased strength;Decreased activity tolerance;Decreased balance;Decreased mobility;Decreased coordination;Decreased safety awareness       PT Treatment Interventions Gait training;Functional mobility training;Therapeutic activities;Therapeutic exercise;Balance training;Neuromuscular re-education    PT Goals (Current goals can be found in the Care Plan section)  Acute Rehab PT Goals Patient Stated Goal: go back home to ALF PT Goal Formulation: With patient Time For Goal Achievement: 04/30/19 Potential to Achieve Goals: Fair    Frequency Min 2X/week   Barriers to discharge        Co-evaluation               AM-PAC PT "6 Clicks" Mobility  Outcome Measure Help needed turning from your back to your side while in a flat bed without using bedrails?: A Lot Help needed moving from lying on your back to sitting on the side of a flat bed without using bedrails?: A Lot Help needed moving to and from a bed to a chair (including a wheelchair)?: A Lot Help needed standing up from a chair using your arms (e.g., wheelchair or bedside chair)?: A Lot Help needed to walk in hospital room?: Total Help needed climbing 3-5 steps with a railing? : Total 6 Click Score: 10    End of Session Equipment Utilized During Treatment: Oxygen Activity Tolerance: Treatment limited secondary to medical complications (Comment) Patient left: in bed;with call bell/phone within reach;with nursing/sitter in room Nurse Communication: Mobility status PT Visit Diagnosis: Unsteadiness on feet (R26.81);Other abnormalities of gait and mobility (R26.89)    Time: 0109-3235 PT Time Calculation (min) (ACUTE ONLY): 25 min   Charges:   PT Evaluation $PT Eval Moderate Complexity: 1 Mod PT Treatments $Therapeutic Activity: 8-22 mins        Horald Chestnut, PT   Delford Field Apr 21, 2019, 4:43 PM

## 2019-04-16 NOTE — Progress Notes (Addendum)
Pt asleep/resting when night RN took report.  Pt on 6L high flow nasal canula and 100% non rebreather mask, sats 88-92%.  No visible distress noted  When RN did med pass pt woke to touch and voice.  Stated she was "tired" when asked how she was feeling.  Pt was able to do inhaler but really wasn't able to take a good deep breath in.

## 2019-04-16 NOTE — Progress Notes (Signed)
Pt transferring from [place] briefly with pharmacist at [facility] received: Remdesivir 200mg  12/10 1232 Levaquin 750mg  IV 12/10 1246 Dexamethasone 6mg  IV 12/10 0844 No anticoagulation given at [facility] records showed that pt was taking pradaxa 75mg  po BID for h/o afib, CVA.  Labs 12/10: ALT 12 SCr 0.8  Plan: Will change remdesivir orders to 100mg  daily x 4 more doses (total of 5 day course)   Sherlon Handing, PharmD, BCPS Please see amion for complete clinical pharmacist phone list Apr 20, 2019 4:35 AM

## 2019-04-16 NOTE — Plan of Care (Signed)

## 2019-04-16 NOTE — Progress Notes (Signed)
Gave update to [employer] from the facility # [phone].  She stated she would attempt to update the family as the day RN was unable to reach them

## 2019-04-16 NOTE — H&P (Addendum)
TRH H&P   Patient Demographics:    Diana Brady, is a 78 y.o. female  MRN: [id number]   DOB - 07-Jun-1940  Admit Date - 04/19/2019  Outpatient Primary MD for the patient is Bonnita Nasuti, MD  Patient coming from: [facility] health  No chief complaint on file.     HPI:    Diana Brady  is a 78 y.o. female, with COPD on 2 L [place] O2, hypertension, atrial fibrillation on Xarelto, dementia, NIDDM, fibromyalgia, HLD, pernicious anemia who presents as a transfer from [place] with increasing oxygen requirement.  Patient is a poor historian, history obtained from reviewing the medical chart.  Patient resides in a SNF, on 2 L [place] O2 at baseline.  Tested for SARS-CoV-2 regularly, initial positive test was reported to be on 04/11/2019, over time she has had progressive shortness of breath, cough prompting her to present to the emergency room.  Repeat test was positive.  No further history is available at this time.   Review of systems:  Review of Systems: Unable to assess reliably  With Past History of the following :   Past Medical History:  Diagnosis Date  . Anemia   . Anxiety   . Atrial fibrillation ([place])   . Carpal tunnel syndrome   . CVA (cerebral infarction)   . Dehydration   . Diabetes mellitus without complication ([place])   . Fibromyalgia   . Glaucoma   . Hyperlipidemia   . Hypertension   . Pernicious anemia   . Shortness of breath   . Syncope   . Thyroid disease   . UTI (urinary tract infection)       Past Surgical History:  Procedure Laterality Date  . APPENDECTOMY    . NEUROPLASTY / TRANSPOSITION MEDIAN NERVE AT CARPAL TUNNEL       Social History:     Social History   Tobacco Use  .  Smoking status: Never Smoker  . Smokeless tobacco: Never Used  Substance Use Topics  . Alcohol use: No     Family History :     Family History  Problem Relation Age of Onset  . Heart disease Mother     Home Medications:   Prior to Admission medications   Medication Sig Start Date End Date Taking? Authorizing Provider  atorvastatin (LIPITOR) 40 MG tablet  Take 40 mg by mouth daily.    [provider]  benazepril (LOTENSIN) 10 MG tablet Take 10 mg by mouth daily.    [provider]  budesonide-formoterol (SYMBICORT) 160-4.5 MCG/ACT inhaler Inhale 2 puffs into the lungs 2 (two) times daily.    [provider]  cloNIDine (CATAPRES) 0.1 MG tablet Take 0.1 mg by mouth 2 (two) times daily.    [provider]  dabigatran (PRADAXA) 75 MG CAPS capsule Take 75 mg by mouth 2 (two) times daily.    [provider]  furosemide (LASIX) 20 MG tablet Take 20 mg by mouth daily.    [provider]  glimepiride (AMARYL) 4 MG tablet Take 4 mg by mouth 2 (two) times daily.    [provider]  insulin glargine (LANTUS) 100 UNIT/ML injection Inject into the skin at bedtime.    [provider]  insulin NPH Human (HUMULIN N,NOVOLIN N) 100 UNIT/ML injection Inject into the skin.    [provider]  ipratropium-albuterol (DUONEB) 0.5-2.5 (3) MG/3ML SOLN Take 3 mLs by nebulization every 6 (six) hours as needed.    [provider]  levothyroxine (SYNTHROID, LEVOTHROID) 25 MCG tablet Take 25 mcg by mouth daily before breakfast.    [provider]  metoprolol succinate (TOPROL-XL) 50 MG 24 hr tablet Take 50 mg by mouth daily. Take with or immediately following a meal.    [provider]  montelukast (SINGULAIR) 10 MG tablet Take 10 mg by mouth at bedtime.    [provider]  olopatadine (PATANOL) 0.1 % ophthalmic solution Place 1 drop into both eyes 2 (two) times daily.    [provider]   pantoprazole (PROTONIX) 40 MG tablet Take 40 mg by mouth daily.    [provider]  PARoxetine (PAXIL) 40 MG tablet Take 40 mg by mouth every morning.    [provider]  scopolamine (ISOPTO HYOSCINE) 0.25 % ophthalmic solution Place 1 drop into both eyes as needed.    [provider]  Tiotropium Bromide Monohydrate (SPIRIVA RESPIMAT IN) Inhale into the lungs.    [provider]     Allergies:     Allergies  Allergen Reactions  . Codeine   . Darvon [Propoxyphene]   . Dilaudid [Hydromorphone Hcl]   . Propoxyphene   . Sulfa Antibiotics   . Tramadol   . Vancomycin      Physical Exam:   Vitals  Blood pressure (!) 166/76, pulse 94, temperature 97.7 F (36.5 C), temperature source Oral, resp. rate (!) 25, SpO2 (!) 85 %.  Physical Exam   Constitutional - resting comfortably, no acute distress Eyes - pupils equal round and reactive to light and accomodation, extra ocular movements intact Nose - no gross deformity or drainage Mouth - no oral lesions noted Throat - no swelling or erythema Neck - supple, no JVD   CV - (+)S1S2, no murmurs  Resp -bilateral lower lung field rales,  GI - (+)BS, soft, non-tender, non-distended Extrem - no clubbing, cyanosis, or peripheral edema  Skin - no rashes or wounds Neuro - alert, aware, oriented to person and place answers most questions appropriately however she is confused and unreliable historian Psych - normal affect, no anxiety   Patient has Pressure Ulcer on Admission?: no   Data Review:    CBC No results for input(s): WBC, HGB, HCT, PLT, MCV, MCH, MCHC, RDW, LYMPHSABS, MONOABS, EOSABS, BASOSABS, BANDABS in the last 168 hours.  Invalid input(s): NEUTRABS, BANDSABD ------------------------------------------------------------------------------------------------------------------  Chemistries  No results for input(s): NA, K, CL, CO2, GLUCOSE, BUN, CREATININE, CALCIUM, MG, AST, ALT, ALKPHOS, BILITOT in  the last 168 hours.  Invalid input(s): GFRCGP ------------------------------------------------------------------------------------------------------------------ CrCl cannot be calculated (No successful lab value found.). ------------------------------------------------------------------------------------------------------------------ No results for input(s): TSH, T4TOTAL, T3FREE, THYROIDAB in the last 72 hours.  Invalid input(s): FREET3  Coagulation profile No results for input(s): INR, PROTIME in the last 168 hours. ------------------------------------------------------------------------------------------------------------------- No results for input(s): DDIMER in the last 72 hours. -------------------------------------------------------------------------------------------------------------------  Cardiac Enzymes No results for input(s): CKMB, TROPONINI, MYOGLOBIN in the last 168 hours.  Invalid input(s): CK ------------------------------------------------------------------------------------------------------------------ No results found for: BNP   ---------------------------------------------------------------------------------------------------------------  Urinalysis No results found for: COLORURINE, APPEARANCEUR, LABSPEC, PHURINE, GLUCOSEU, HGBUR, BILIRUBINUR, KETONESUR, PROTEINUR, UROBILINOGEN, NITRITE, LEUKOCYTESUR  ----------------------------------------------------------------------------------------------------------------   Imaging Results:    No results found.   Assessment & Plan:    Principal Problem:   Acute on chronic respiratory failure with hypoxia (HCC) Active Problems:   Pneumonia due to COVID-19 virus   Type 2 diabetes mellitus with complication, without long-term current use of insulin (HCC)   Hypertension   Hyperlipidemia   Atrial fibrillation (HCC)     Acute on chronic respiratory failure with hypoxia/Pneumonia due to COVID-19 virus/COPD: Patient  with history of COPD on 2 L at baseline presenting with increasing oxygen requirement for a few days after testing positive for SARS-CoV-2 infection.  Currently on 6 L [place] O2 with sats in the low 90s.  CXR with bilateral infiltrates.  Admit patient for supportive care: Date of Dx: 04/11/2019 Oxygen requirements: 6 LPM(on 2 LPM at baseline) Antibiotics: Doxycycline Diuretics: N/A   Vitamin C and Zinc: Per protocol Remdesivir: Started on 04/15/2019 Steroids: Started on 04/15/2019 Actemra: Not given yet Convalescent Plasma: Not given yet DVT Prophylaxis:   Xarelto     Type 2 diabetes mellitus with complication, without long-term current use of insulin: Diabetic diet.  Fingersticks before meals and at bedtime.  Sliding scale insulin    Hypertension: Blood pressures above but close to goal.  Resume home medications once verified by pharmacy.    Hyperlipidemia: Diabetic diet.  Resume statin when verified by pharmacy    Atrial fibrillation: Rate is controlled.  Continue Xarelto.  Restart a beta-blocker once verified by pharmacy.   DVT Prophylaxis Xarelto  AM Labs Ordered, also please review Full Orders  Family Communication: Admission, patients condition and plan of care including tests being ordered have been discussed with the patient who indicate understanding and agree with the plan and Code Status.  Code Status full code  Likely DC to SNF  Condition GUARDED    Consults called: N/A    Admission status: Admit inpatient  Time spent in minutes : 5658   Coletta Memosrinity Oletha Tolson M.D on 06/02/18 at 4:23 AM  To page go to [email or web address] - password [email or web address]

## 2019-04-16 NOTE — Progress Notes (Signed)
Patient was seen at request of Dr Barb Merino  She is currently comfortable but requiring 15 L HFNC and 100% NRB to maintain sats >90%  Patient is currently awake and alert (RN at bedside)  I explained rationale,risk and benefits of off label actemra and covid convalescent plasma-she understood and agrees to the use of these agents. I have made Dr Sloan Leiter aware of the patient's consent  The rationale for the off label use of Actemra its known side effects, potential benefits was  discussed with patient.The use of Actemra is based on published clinical articles/anecdotal data-there have been several negative trials, although lately there have been 2 positive trials as well. Patient denies any hx of TB, Hep B or C. Complete risks and long-term side effects are unknown, however in the best clinical judgment it is felt that the clinical benefit at this time outweighs medical risks given tenuous clinical state of the patient.  Patient agree's with the treatment plan and consent to the use of Actemra.

## 2019-04-16 NOTE — Progress Notes (Signed)
PROGRESS NOTE    Diana Brady  NID:[id number] DOB: 1940-12-04 DOA: May 07, 2019 PCP: Galvin Proffer, MD    Brief Narrative:  Patient 78 year old female with history of COPD on 2 L oxygen at home, diabetes mellitus, chronic debility who lives in an assisted living facility presented to the [facility] emergency room after testing positive for COVID-19 on 04/11/2019 with increasing shortness of breath and fatigue.  Patient also complained of decreased appetite.  Patient had visited emergency room twice.  Patient was complaining of not feeling well and generalized weakness.  In the emergency room, she needed 4 to 6 L of oxygen to keep saturation more than 90%.  Chest x-ray showed bilateral infiltrates and interstitial edema.  Repeat COVID-19 was positive.  She was admitted to [facility] campus to treat for COVID-19 related pneumonia. Admitted, early morning hours of 05/07/19.  Assessment & Plan:   Principal Problem:   Acute on chronic respiratory failure with hypoxia (HCC) Active Problems:   Type 2 diabetes mellitus with complication, without long-term current use of insulin (HCC)   Pneumonia due to COVID-19 virus   Hypertension   Hyperlipidemia   Atrial fibrillation (HCC)  Pneumonia due to COVID-19 virus, acute on chronic hypoxic respiratory failure: Continue to monitor due to significant symptoms and increasing oxygen demand. chest physiotherapy, incentive spirometry, deep breathing exercises, sputum induction, mucolytic's and bronchodilators. Supplemental oxygen to keep saturations more than 89 %. Covid directed therapy with , steroids, dexamethasone day 2/10 remdesivir, day 2/5 actemra, patient could not consent, could not reach family.  CRP is 20. antibiotics, procalcitonin elevated 0.17.  1 dose of Levaquin in the emergency room, currently on doxycycline. Due to severity of symptoms, patient will need daily inflammatory markers, chest x-rays, liver function test to monitor and  direct COVID-19 therapies. Repeat chest x-ray today.  Type 2 diabetes mellitus with long-term insulin use: Hyperglycemia. Expected hyperglycemia with ongoing use of dexamethasone in a patient with diabetes.  Currently started on sliding scale insulin coverage.  Medication to be verified.  Patient will need some long-acting insulin, will decide after few Accu-Cheks.  Paroxysmal atrial fibrillation: Currently in sinus rhythm.  Rate controlled.  She is on Pradaxa that was continued.  Hypokalemia: We will replace and monitor levels.  Will check magnesium phosphorus with morning labs.   DVT prophylaxis: Pradaxa Code Status: Full code Family Communication: Try to contact patient's son, mobile number listed is nonexistent, home phone did not pick up Disposition Plan: Remains active inpatient treatment in the hospital.  Anticipate discharge back to assisted living facility versus SNF when is stable.  Will mobilize with therapies.   Consultants:   None  Procedures:   None  Antimicrobials:   Doxycycline, May 07, 2019----  Remdesivir, 04/15/2019, started at [facility]----   Subjective: Patient seen and examined.  Admitted early morning hours.  She is poor historian.  Overall she just does not feel well.  Afebrile overnight.  Oxygen requirement is 4 to 6 L and drops on mobility.  Objective: Vitals:   2019/05/07 0339 2019-05-07 0400 05/07/19 0750  BP: (!) 166/76  (!) 154/69  Pulse: 94 94 97  Resp: (!) 23 (!) 25 19  Temp: 97.7 F (36.5 C)  100.1 F (37.8 C)  TempSrc: Oral  Oral  SpO2: (!) 88% (!) 85% (!) 89%    Intake/Output Summary (Last 24 hours) at 2019-05-07 1020 Last data filed at May 07, 2019 0751 Gross per 24 hour  Intake 240 ml  Output --  Net 240 ml   There  were no vitals filed for this visit.  Vitals:   04/23/2019 0339 04/11/2019 0400 05/04/2019 0750  BP: (!) 166/76  (!) 154/69  Pulse: 94 94 97  Temp: 97.7 F (36.5 C)  100.1 F (37.8 C)  Resp: (!) 23 (!) 25 19  SpO2: (!)  88% (!) 85% (!) 89%  TempSrc: Oral  Oral    Examination:  General exam: Appears calm and comfortable , chronically sick looking.  Not in any distress, anxious. Respiratory system: Clear to auscultation. Respiratory effort normal.  Mostly conducted airway sounds.  No added sounds. Cardiovascular system: S1 & S2 heard, RRR. No JVD, murmurs, rubs, gallops or clicks. No pedal edema. Gastrointestinal system: Abdomen is nondistended, soft and nontender. No organomegaly or masses felt. Normal bowel sounds heard. Central nervous system: Alert and oriented. No focal neurological deficits. Extremities: Symmetric 5 x 5 power. Skin: No rashes, lesions or ulcers Psychiatry: Judgement and insight appear normal. Mood & affect flat and anxious.    Data Reviewed: I have personally reviewed following labs and imaging studies  CBC: Recent Labs  Lab 04/28/2019 0732  WBC 9.4  NEUTROABS 8.7*  HGB 9.7*  HCT 31.8*  MCV 81.7  PLT 627*   Basic Metabolic Panel: Recent Labs  Lab 04/12/2019 0732  NA 135  K 3.2*  CL 96*  CO2 24  GLUCOSE 277*  BUN 26*  CREATININE 1.00  CALCIUM 8.0*   GFR: CrCl cannot be calculated (Unknown ideal weight.). Liver Function Tests: Recent Labs  Lab 04/18/2019 0732  AST 24  ALT 14  ALKPHOS 63  BILITOT 1.2  PROT 6.1*  ALBUMIN 2.8*   No results for input(s): LIPASE, AMYLASE in the last 168 hours. No results for input(s): AMMONIA in the last 168 hours. Coagulation Profile: No results for input(s): INR, PROTIME in the last 168 hours. Cardiac Enzymes: No results for input(s): CKTOTAL, CKMB, CKMBINDEX, TROPONINI in the last 168 hours. BNP (last 3 results) No results for input(s): PROBNP in the last 8760 hours. HbA1C: Recent Labs    04/19/2019 0732  HGBA1C 8.1*   CBG: Recent Labs  Lab 05/01/2019 0746  GLUCAP 274*   Lipid Profile: No results for input(s): CHOL, HDL, LDLCALC, TRIG, CHOLHDL, LDLDIRECT in the last 72 hours. Thyroid Function Tests: No results for  input(s): TSH, T4TOTAL, FREET4, T3FREE, THYROIDAB in the last 72 hours. Anemia Panel: Recent Labs    04/09/2019 0732  FERRITIN 1,360*   Sepsis Labs: Recent Labs  Lab 04/06/2019 0732  PROCALCITON 0.17    No results found for this or any previous visit (from the past 240 hour(s)).       Radiology Studies: No results found.      Scheduled Meds: . dabigatran  75 mg Oral Q12H  . dexamethasone  6 mg Oral Q24H  . docusate sodium  100 mg Oral Daily  . insulin aspart  0-20 Units Subcutaneous TID WC  . insulin aspart  0-5 Units Subcutaneous QHS  . Ipratropium-Albuterol  1 puff Inhalation Q6H  . nystatin   Topical TID  . vitamin C  500 mg Oral Daily  . zinc sulfate  220 mg Oral Daily   Continuous Infusions: . doxycycline (VIBRAMYCIN) IV    . remdesivir 100 mg in NS 100 mL       LOS: 0 days    Time spent: 30 minutes    Barb Merino, MD [facility] Pager [phone]

## 2019-04-16 NOTE — Progress Notes (Signed)
Discussed with patient about 2 more modalities of treatment, side effects and possible benefits as below:   #1 convalescent plasma: Experimental.  patient with worsening hypoxemia.  Discussed with patient that this is one of the experimental modality of COVID 19 treatment that involves giving plasma from patients who have recovered from infection.  This is one of the blood product. This may or may not benefit in the current state of disease. in some instatnces, it may improve survival and recovery.  Side effects include reactions from plasma proteins that are foreign bodies and very rare chances of infections that are treatable.  Patient is agreeable for convalescent plasma transfusion and has signed the consent with witness.   #2. Tocilizumab: This is experimental medication.  May or may not benefit.  Has rare side effect like reactivation of tuberculosis or fungal infections.  At this stage of critical illness, her benefits outweigh risk.  She does understand and agrees to all available modalities of treatment.  We will give 1 dose of Tocilizumab.

## 2019-04-16 NOTE — Progress Notes (Signed)
Attempted to call pt son at home, work, and mobile number but didn't get an answer.

## 2019-04-17 ENCOUNTER — Inpatient Hospital Stay (HOSPITAL_COMMUNITY): Payer: Medicare HMO

## 2019-04-17 LAB — COMPREHENSIVE METABOLIC PANEL
ALT: 12 U/L (ref 0–44)
AST: 22 U/L (ref 15–41)
Albumin: 2.7 g/dL — ABNORMAL LOW (ref 3.5–5.0)
Alkaline Phosphatase: 62 U/L (ref 38–126)
Anion gap: 12 (ref 5–15)
BUN: 31 mg/dL — ABNORMAL HIGH (ref 8–23)
CO2: 25 mmol/L (ref 22–32)
Calcium: 8.4 mg/dL — ABNORMAL LOW (ref 8.9–10.3)
Chloride: 101 mmol/L (ref 98–111)
Creatinine, Ser: 0.92 mg/dL (ref 0.44–1.00)
GFR calc Af Amer: >60 mL/min (ref >60)
GFR calc non Af Amer: >60 mL/min (ref >60)
Glucose, Bld: 286 mg/dL — ABNORMAL HIGH (ref 70–99)
Potassium: 4 mmol/L (ref 3.5–5.1)
Sodium: 138 mmol/L (ref 135–145)
Total Bilirubin: 1 mg/dL (ref 0.3–1.2)
Total Protein: 6.2 g/dL — ABNORMAL LOW (ref 6.5–8.1)

## 2019-04-17 LAB — BASIC METABOLIC PANEL
Anion gap: 12 (ref 5–15)
BUN: 33 mg/dL — ABNORMAL HIGH (ref 8–23)
CO2: 23 mmol/L (ref 22–32)
Calcium: 8.4 mg/dL — ABNORMAL LOW (ref 8.9–10.3)
Chloride: 101 mmol/L (ref 98–111)
Creatinine, Ser: 1 mg/dL (ref 0.44–1.00)
GFR calc Af Amer: >60 mL/min (ref >60)
GFR calc non Af Amer: 54 mL/min — ABNORMAL LOW (ref >60)
Glucose, Bld: 612 mg/dL (ref 70–99)
Potassium: 4.2 mmol/L (ref 3.5–5.1)
Sodium: 136 mmol/L (ref 135–145)

## 2019-04-17 LAB — CBC WITH DIFFERENTIAL/PLATELET
Abs Immature Granulocytes: 0.12 10*3/uL — ABNORMAL HIGH (ref 0.00–0.07)
Basophils Absolute: 0 10*3/uL (ref 0.0–0.1)
Basophils Relative: 0 %
Eosinophils Absolute: 0 10*3/uL (ref 0.0–0.5)
Eosinophils Relative: 0 %
HCT: 33.2 % — ABNORMAL LOW (ref 36.0–46.0)
Hemoglobin: 10 g/dL — ABNORMAL LOW (ref 12.0–15.0)
Immature Granulocytes: 2 %
Lymphocytes Relative: 3 %
Lymphs Abs: 0.3 10*3/uL — ABNORMAL LOW (ref 0.7–4.0)
MCH: 25.1 pg — ABNORMAL LOW (ref 26.0–34.0)
MCHC: 30.1 g/dL (ref 30.0–36.0)
MCV: 83.2 fL (ref 80.0–100.0)
Monocytes Absolute: 0.2 10*3/uL (ref 0.1–1.0)
Monocytes Relative: 3 %
Neutro Abs: 7.3 10*3/uL (ref 1.7–7.7)
Neutrophils Relative %: 92 %
Platelets: 135 10*3/uL — ABNORMAL LOW (ref 150–400)
RBC: 3.99 MIL/uL (ref 3.87–5.11)
RDW: 16.1 % — ABNORMAL HIGH (ref 11.5–15.5)
WBC: 8 10*3/uL (ref 4.0–10.5)
nRBC: 0 % (ref 0.0–0.2)

## 2019-04-17 LAB — GLUCOSE, CAPILLARY
Glucose-Capillary: 246 mg/dL — ABNORMAL HIGH (ref 70–99)
Glucose-Capillary: 285 mg/dL — ABNORMAL HIGH (ref 70–99)
Glucose-Capillary: 542 mg/dL (ref 70–99)
Glucose-Capillary: 461 mg/dL — ABNORMAL HIGH (ref 70–99)
Glucose-Capillary: 507 mg/dL (ref 70–99)
Glucose-Capillary: 404 mg/dL — ABNORMAL HIGH (ref 70–99)
Glucose-Capillary: 292 mg/dL — ABNORMAL HIGH (ref 70–99)
Glucose-Capillary: 354 mg/dL — ABNORMAL HIGH (ref 70–99)

## 2019-04-17 LAB — FERRITIN: Ferritin: 2243 ng/mL — ABNORMAL HIGH (ref 11–307)

## 2019-04-17 LAB — ABO/RH: ABO/RH(D): AB POS

## 2019-04-17 LAB — C-REACTIVE PROTEIN: CRP: 17.5 mg/dL — ABNORMAL HIGH (ref <1.0)

## 2019-04-17 LAB — D-DIMER, QUANTITATIVE: D-Dimer, Quant: 1.54 ug/mL-FEU — ABNORMAL HIGH (ref 0.00–0.50)

## 2019-04-17 LAB — MAGNESIUM: Magnesium: 1.9 mg/dL (ref 1.7–2.4)

## 2019-04-17 LAB — PHOSPHORUS: Phosphorus: 2.6 mg/dL (ref 2.5–4.6)

## 2019-04-17 MED ORDER — INSULIN DETEMIR 100 UNIT/ML ~~LOC~~ SOLN
15.0000 [IU] | Freq: Two times a day (BID) | SUBCUTANEOUS | Status: DC
  Filled 2019-04-17: qty 0.15

## 2019-04-17 MED ORDER — INSULIN DETEMIR 100 UNIT/ML ~~LOC~~ SOLN
30.0000 [IU] | Freq: Two times a day (BID) | SUBCUTANEOUS | Status: DC
  Filled 2019-04-17: qty 0.3

## 2019-04-17 MED ORDER — DEXTROSE 50 % IV SOLN: 0.0000 mL | INTRAVENOUS | Status: DC | PRN

## 2019-04-17 MED ORDER — INSULIN ASPART 100 UNIT/ML ~~LOC~~ SOLN: 15.0000 [IU] | Freq: Three times a day (TID) | SUBCUTANEOUS | Status: DC

## 2019-04-17 MED ORDER — INSULIN REGULAR(HUMAN) IN NACL 100-0.9 UT/100ML-% IV SOLN
INTRAVENOUS | Status: DC
  Administered 2019-04-17: 16 [IU]/h via INTRAVENOUS
  Administered 2019-04-18: 8 [IU]/h via INTRAVENOUS
  Filled 2019-04-17 (×4): qty 100

## 2019-04-17 MED ORDER — INSULIN ASPART 100 UNIT/ML ~~LOC~~ SOLN
20.0000 [IU] | Freq: Once | SUBCUTANEOUS | Status: AC
  Administered 2019-04-17: 20 [IU] via SUBCUTANEOUS

## 2019-04-17 NOTE — Progress Notes (Signed)
Patient has very high blood sugars >600 , no anion gap. Can not give iv fluids. Will start on insulin infusion to control blood sugars. Endo tool ordered.

## 2019-04-17 NOTE — Progress Notes (Signed)
PROGRESS NOTE    Diana Brady  WUJ:[id number] DOB: 11/14/1940 DOA: 06/05/18 PCP: Galvin ProfferHague, Imran P, MD    Brief Narrative:  Patient 78 year old female with history of COPD on 2 L oxygen at home, diabetes mellitus, chronic debility who lives in an assisted living facility presented to the [facility] emergency room after testing positive for COVID-19 on 04/11/2019 with increasing shortness of breath and fatigue.  Patient also complained of decreased appetite.  Patient had visited emergency room twice.  Patient was complaining of not feeling well and generalized weakness.  In the emergency room, she needed 4 to 6 L of oxygen to keep saturation more than 90%.  Chest x-ray showed bilateral infiltrates and interstitial edema.  Repeat COVID-19 was positive.  She was admitted to [facility] to treat for COVID-19 related pneumonia. Admitted, early morning hours of 06/05/18.  Assessment & Plan:   Principal Problem:   Acute on chronic respiratory failure with hypoxia (HCC) Active Problems:   Type 2 diabetes mellitus with complication, without long-term current use of insulin (HCC)   Pneumonia due to COVID-19 virus   Hypertension   Hyperlipidemia   Atrial fibrillation (HCC)  Pneumonia due to COVID-19 virus, acute on chronic hypoxic respiratory failure: Continue to monitor due to significant symptoms and increasing oxygen demand. chest physiotherapy, incentive spirometry, deep breathing exercises, sputum induction, mucolytic's and bronchodilators. Supplemental oxygen to keep saturations more than 89 %. Covid directed therapy with , steroids, dexamethasone day 3/10 remdesivir, day 3/5 actemra, a single dose given on 06/05/18 after consent.  Implied markers improving. Patient received 1 unit of convalescent plasma on 06/05/18. antibiotics, procalcitonin elevated 0.17.  1 dose of Levaquin in the emergency room, currently on doxycycline. Due to severity of symptoms, patient will need  daily inflammatory markers, chest x-rays, liver function test to monitor and direct COVID-19 therapies. Repeat chest x-ray today ordered and reviewed, it shows improvement of aeration.  Type 2 diabetes mellitus with long-term insulin use: Hyperglycemia. Expected hyperglycemia with ongoing use of dexamethasone in a patient with diabetes.  Increase dose of Levemir.  Continue high intensity sliding scale.   Paroxysmal atrial fibrillation: Currently in sinus rhythm.  Rate controlled.  She is on Pradaxa that was continued.  Hypokalemia: Replaced with improvement.  DVT prophylaxis: Pradaxa Code Status: Full code Family Communication: call placed to son. Home and mobile . Unable  Disposition Plan: Remains active inpatient treatment in the hospital.  Anticipate discharge back to assisted living facility versus SNF when is stable.  Will mobilize with therapies.   Consultants:   None  Procedures:   None  Antimicrobials:   Doxycycline, 06/05/18----  Remdesivir, 04/15/2019, started at [facility]----   Subjective:  Patient seen and examined.  Remains very weak.  Oxygen requirement fluctuate.  Was able to talk in full sentences.  Objective: Vitals:   04/17/19 0500 04/17/19 0539 04/17/19 0800 04/17/19 0809  BP: (!) 146/64 (!) 148/76 (!) 154/72 (!) 154/72  Pulse: 90 92 84 87  Resp:   (!) 21 18  Temp: 98 F (36.7 C) 97.6 F (36.4 C)  97.8 F (36.6 C)  TempSrc: Axillary Axillary  Axillary  SpO2: 93% 91% 90% 94%  Weight:        Intake/Output Summary (Last 24 hours) at 04/17/2019 1229 Last data filed at 04/17/2019 1200 Gross per 24 hour  Intake 2194.11 ml  Output 1200 ml  Net 994.11 ml   Filed Weights   04/23/2019 1703  Weight: 77.3 kg    Vitals:  04/17/19 0500 04/17/19 0539 04/17/19 0800 04/17/19 0809  BP: (!) 146/64 (!) 148/76 (!) 154/72 (!) 154/72  Pulse: 90 92 84 87  Temp: 98 F (36.7 C) 97.6 F (36.4 C)  97.8 F (36.6 C)  Resp:   (!) 21 18  Weight:      SpO2:  93% 91% 90% 94%  TempSrc: Axillary Axillary  Axillary    Examination:  General exam: Appears calm and comfortable , chronically sick looking.  Not in any distress, currently on 10 L oxygen. Respiratory system: Clear to auscultation. Respiratory effort normal.  Mostly conducted airway sounds.  No added sounds. Cardiovascular system: S1 & S2 heard, RRR. No JVD, murmurs, rubs, gallops or clicks. No pedal edema. Gastrointestinal system: Abdomen is nondistended, soft and nontender. No organomegaly or masses felt. Normal bowel sounds heard. Central nervous system: Alert and oriented. No focal neurological deficits. Extremities: Symmetric 5 x 5 power. Skin: No rashes, lesions or ulcers Psychiatry: Judgement and insight appear normal. Mood & affect flat and anxious.    Data Reviewed: I have personally reviewed following labs and imaging studies  CBC: Recent Labs  Lab 05-14-2019 0732 04/17/19 0159  WBC 9.4 8.0  NEUTROABS 8.7* 7.3  HGB 9.7* 10.0*  HCT 31.8* 33.2*  MCV 81.7 83.2  PLT 133* 135*   Basic Metabolic Panel: Recent Labs  Lab 2019/05/14 0732 04/17/19 0159  NA 135 138  K 3.2* 4.0  CL 96* 101  CO2 24 25  GLUCOSE 277* 286*  BUN 26* 31*  CREATININE 1.00 0.92  CALCIUM 8.0* 8.4*  MG  --  1.9  PHOS  --  2.6   GFR: CrCl cannot be calculated (Unknown ideal weight.). Liver Function Tests: Recent Labs  Lab 05/14/19 0732 04/17/19 0159  AST 24 22  ALT 14 12  ALKPHOS 63 62  BILITOT 1.2 1.0  PROT 6.1* 6.2*  ALBUMIN 2.8* 2.7*   No results for input(s): LIPASE, AMYLASE in the last 168 hours. No results for input(s): AMMONIA in the last 168 hours. Coagulation Profile: No results for input(s): INR, PROTIME in the last 168 hours. Cardiac Enzymes: No results for input(s): CKTOTAL, CKMB, CKMBINDEX, TROPONINI in the last 168 hours. BNP (last 3 results) No results for input(s): PROBNP in the last 8760 hours. HbA1C: Recent Labs    14-May-2019 0732  HGBA1C 8.1*   CBG: Recent  Labs  Lab 05-14-19 1140 2019/05/14 1748 14-May-2019 2149 04/17/19 0738 04/17/19 1144  GLUCAP 279* 347* 321* 246* 285*   Lipid Profile: No results for input(s): CHOL, HDL, LDLCALC, TRIG, CHOLHDL, LDLDIRECT in the last 72 hours. Thyroid Function Tests: No results for input(s): TSH, T4TOTAL, FREET4, T3FREE, THYROIDAB in the last 72 hours. Anemia Panel: Recent Labs    2019-05-14 0732 04/17/19 0159  FERRITIN 1,360* 2,243*   Sepsis Labs: Recent Labs  Lab 2019-05-14 0732  PROCALCITON 0.17    Recent Results (from the past 240 hour(s))  Culture, blood (Routine X 2) w Reflex to ID Panel     Status: None (Preliminary result)   Collection Time: 05-14-19  7:25 AM   Specimen: BLOOD  Result Value Ref Range Status   Specimen Description   Final    BLOOD RIGHT ASSIST CONTROL Performed at [facility], [address], [place], [place] [address]    Special Requests   Final    BOTTLES DRAWN AEROBIC ONLY Blood Culture adequate volume Performed at [facility], [address], [place], [place] [address]    Culture  Final    NO GROWTH < 24 HOURS Performed at [facility], [place] [address], [place], [place] [address]    Report Status PENDING  Incomplete  Culture, blood (Routine X 2) w Reflex to ID Panel     Status: None (Preliminary result)   Collection Time: 05/01/2019  7:32 AM   Specimen: BLOOD  Result Value Ref Range Status   Specimen Description   Final    BLOOD RIGHT ARM Performed at [place] [address] [place], [place] [address]    Special Requests   Final    BOTTLES DRAWN AEROBIC ONLY Blood Culture adequate volume Performed at [facility] [address], [place], [place] [address]    Culture   Final    NO GROWTH < 24 HOURS Performed at [facility] [address] [place], [place] [address]    Report Status PENDING  Incomplete         Radiology Studies: DG CHEST PORT 1  VIEW  Result Date: 04/07/2019 CLINICAL DATA:  Pneumonia. EXAM: PORTABLE CHEST 1 VIEW COMPARISON:  April 15, 2019. FINDINGS: Stable cardiomegaly. No pneumothorax is noted. Stable bilateral lung opacities are noted consistent with multifocal pneumonia. Bony thorax is unremarkable. IMPRESSION: Stable bilateral lung opacities are noted consistent with multifocal pneumonia. Continued radiographic follow-up is recommended. Electronically Signed   By: Marijo Conception M.D.   On: 04/24/2019 10:47        Scheduled Meds: . sodium chloride   Intravenous Once  . dabigatran  75 mg Oral Q12H  . dexamethasone  6 mg Oral Q24H  . docusate sodium  100 mg Oral Daily  . insulin aspart  0-20 Units Subcutaneous TID WC  . insulin aspart  0-5 Units Subcutaneous QHS  . insulin detemir  15 Units Subcutaneous BID  . Ipratropium-Albuterol  1 puff Inhalation Q6H  . nystatin   Topical TID  . vitamin C  500 mg Oral Daily  . zinc sulfate  220 mg Oral Daily   Continuous Infusions: . doxycycline (VIBRAMYCIN) IV 100 mg (04/17/19 1150)  . remdesivir 100 mg in NS 100 mL Stopped (04/17/19 0858)     LOS: 1 day    Time spent: 30 minutes    Barb Merino, MD [facility] Pager [phone]

## 2019-04-18 ENCOUNTER — Inpatient Hospital Stay (HOSPITAL_COMMUNITY): Payer: Medicare HMO

## 2019-04-18 ENCOUNTER — Inpatient Hospital Stay: Payer: Self-pay

## 2019-04-18 LAB — CBC WITH DIFFERENTIAL/PLATELET
Abs Immature Granulocytes: 0.32 10*3/uL — ABNORMAL HIGH (ref 0.00–0.07)
Basophils Absolute: 0 10*3/uL (ref 0.0–0.1)
Basophils Relative: 0 %
Eosinophils Absolute: 0 10*3/uL (ref 0.0–0.5)
Eosinophils Relative: 0 %
HCT: 32.7 % — ABNORMAL LOW (ref 36.0–46.0)
Hemoglobin: 9.9 g/dL — ABNORMAL LOW (ref 12.0–15.0)
Immature Granulocytes: 2 %
Lymphocytes Relative: 3 %
Lymphs Abs: 0.4 10*3/uL — ABNORMAL LOW (ref 0.7–4.0)
MCH: 25.7 pg — ABNORMAL LOW (ref 26.0–34.0)
MCHC: 30.3 g/dL (ref 30.0–36.0)
MCV: 84.9 fL (ref 80.0–100.0)
Monocytes Absolute: 0.9 10*3/uL (ref 0.1–1.0)
Monocytes Relative: 6 %
Neutro Abs: 13.5 10*3/uL — ABNORMAL HIGH (ref 1.7–7.7)
Neutrophils Relative %: 89 %
Platelets: 163 10*3/uL (ref 150–400)
RBC: 3.85 MIL/uL — ABNORMAL LOW (ref 3.87–5.11)
RDW: 15.9 % — ABNORMAL HIGH (ref 11.5–15.5)
WBC: 15.2 10*3/uL — ABNORMAL HIGH (ref 4.0–10.5)
nRBC: 0 % (ref 0.0–0.2)

## 2019-04-18 LAB — COMPREHENSIVE METABOLIC PANEL
ALT: 14 U/L (ref 0–44)
AST: 25 U/L (ref 15–41)
Albumin: 2.9 g/dL — ABNORMAL LOW (ref 3.5–5.0)
Alkaline Phosphatase: 68 U/L (ref 38–126)
Anion gap: 10 (ref 5–15)
BUN: 34 mg/dL — ABNORMAL HIGH (ref 8–23)
CO2: 25 mmol/L (ref 22–32)
Calcium: 8.8 mg/dL — ABNORMAL LOW (ref 8.9–10.3)
Chloride: 104 mmol/L (ref 98–111)
Creatinine, Ser: 0.73 mg/dL (ref 0.44–1.00)
GFR calc Af Amer: >60 mL/min (ref >60)
GFR calc non Af Amer: >60 mL/min (ref >60)
Glucose, Bld: 192 mg/dL — ABNORMAL HIGH (ref 70–99)
Potassium: 3.7 mmol/L (ref 3.5–5.1)
Sodium: 139 mmol/L (ref 135–145)
Total Bilirubin: 0.9 mg/dL (ref 0.3–1.2)
Total Protein: 6 g/dL — ABNORMAL LOW (ref 6.5–8.1)

## 2019-04-18 LAB — GLUCOSE, CAPILLARY
Glucose-Capillary: 110 mg/dL — ABNORMAL HIGH (ref 70–99)
Glucose-Capillary: 120 mg/dL — ABNORMAL HIGH (ref 70–99)
Glucose-Capillary: 123 mg/dL — ABNORMAL HIGH (ref 70–99)
Glucose-Capillary: 195 mg/dL — ABNORMAL HIGH (ref 70–99)
Glucose-Capillary: 219 mg/dL — ABNORMAL HIGH (ref 70–99)
Glucose-Capillary: 242 mg/dL — ABNORMAL HIGH (ref 70–99)
Glucose-Capillary: 338 mg/dL — ABNORMAL HIGH (ref 70–99)
Glucose-Capillary: 362 mg/dL — ABNORMAL HIGH (ref 70–99)
Glucose-Capillary: 398 mg/dL — ABNORMAL HIGH (ref 70–99)
Glucose-Capillary: 85 mg/dL (ref 70–99)
Glucose-Capillary: 89 mg/dL (ref 70–99)
Glucose-Capillary: 96 mg/dL (ref 70–99)
Glucose-Capillary: 99 mg/dL (ref 70–99)

## 2019-04-18 LAB — D-DIMER, QUANTITATIVE: D-Dimer, Quant: 0.99 ug/mL-FEU — ABNORMAL HIGH (ref 0.00–0.50)

## 2019-04-18 LAB — FERRITIN: Ferritin: 2701 ng/mL — ABNORMAL HIGH (ref 11–307)

## 2019-04-18 LAB — MAGNESIUM: Magnesium: 1.8 mg/dL (ref 1.7–2.4)

## 2019-04-18 LAB — C-REACTIVE PROTEIN: CRP: 9.3 mg/dL — ABNORMAL HIGH (ref <1.0)

## 2019-04-18 LAB — PHOSPHORUS: Phosphorus: 1.9 mg/dL — ABNORMAL LOW (ref 2.5–4.6)

## 2019-04-18 MED ORDER — INSULIN DETEMIR 100 UNIT/ML ~~LOC~~ SOLN
30.0000 [IU] | Freq: Two times a day (BID) | SUBCUTANEOUS | Status: DC
  Administered 2019-04-18 – 2019-04-19 (×3): 30 [IU] via SUBCUTANEOUS
  Filled 2019-04-18 (×5): qty 0.3

## 2019-04-18 MED ORDER — DABIGATRAN ETEXILATE MESYLATE 150 MG PO CAPS
150.0000 mg | ORAL_CAPSULE | Freq: Two times a day (BID) | ORAL | Status: DC
  Administered 2019-04-18 – 2019-04-19 (×2): 150 mg via ORAL
  Filled 2019-04-18 (×4): qty 1

## 2019-04-18 MED ORDER — PNEUMOCOCCAL VAC POLYVALENT 25 MCG/0.5ML IJ INJ
0.5000 mL | INJECTION | INTRAMUSCULAR | Status: AC
  Administered 2019-04-19: 11:00:00 0.5 mL via INTRAMUSCULAR
  Filled 2019-04-18: qty 0.5

## 2019-04-18 MED ORDER — INSULIN ASPART 100 UNIT/ML ~~LOC~~ SOLN
0.0000 [IU] | SUBCUTANEOUS | Status: DC
  Administered 2019-04-18 (×2): 20 [IU] via SUBCUTANEOUS
  Administered 2019-04-19: 7 [IU] via SUBCUTANEOUS
  Administered 2019-04-19: 20 [IU] via SUBCUTANEOUS
  Administered 2019-04-19: 3 [IU] via SUBCUTANEOUS
  Administered 2019-04-19: 22:00:00 20 [IU] via SUBCUTANEOUS
  Administered 2019-04-19: 7 [IU] via SUBCUTANEOUS

## 2019-04-18 MED ORDER — SODIUM CHLORIDE 0.9% FLUSH: 10.0000 mL | INTRAVENOUS | Status: DC | PRN

## 2019-04-18 MED ORDER — FUROSEMIDE 10 MG/ML IJ SOLN
40.0000 mg | Freq: Once | INTRAMUSCULAR | Status: AC
  Administered 2019-04-18: 13:00:00 40 mg via INTRAVENOUS
  Filled 2019-04-18: qty 4

## 2019-04-18 MED ORDER — POTASSIUM PHOSPHATES 15 MMOLE/5ML IV SOLN
20.0000 mmol | Freq: Once | INTRAVENOUS | Status: AC
  Administered 2019-04-18: 13:00:00 20 mmol via INTRAVENOUS
  Filled 2019-04-18: qty 6.67

## 2019-04-18 MED ORDER — CHLORHEXIDINE GLUCONATE CLOTH 2 % EX PADS
6.0000 | MEDICATED_PAD | Freq: Every day | CUTANEOUS | Status: DC
  Administered 2019-04-18 – 2019-04-19 (×2): 6 via TOPICAL

## 2019-04-18 MED ORDER — TRAZODONE HCL 50 MG PO TABS
50.0000 mg | ORAL_TABLET | Freq: Every evening | ORAL | Status: DC | PRN
  Administered 2019-04-19: 22:00:00 50 mg via ORAL
  Filled 2019-04-18: qty 1

## 2019-04-18 MED ORDER — INFLUENZA VAC A&B SA ADJ QUAD 0.5 ML IM PRSY
0.5000 mL | PREFILLED_SYRINGE | INTRAMUSCULAR | Status: AC
  Administered 2019-04-19: 0.5 mL via INTRAMUSCULAR
  Filled 2019-04-18: qty 0.5

## 2019-04-18 NOTE — Progress Notes (Signed)
Occupational Therapy Evaluation Patient Details Name: Charlene Brookeatricia A Schuknecht MRN: [id number] DOB: 05-27-1940 Today's Date: 04/18/2019    History of Present Illness 78 year old female with history of COPD on 2 L oxygen at home, diabetes mellitus, chronic debility who lives in an assisted living facility presented to the [facility] emergency room after testing positive for COVID-19 on 04/11/2019 with increasing shortness of breath and fatigue. Repeat COVID-19 was positive.  She was admitted to [facility] to treat for COVID-19 related pneumonia.   Clinical Impression   PTA pt lived at an ALF, independent with self-care and mobility tasks per report. Pt reports 1 fall in the last 6 months and states that she ambulates with a RW around facility. Pt uses 2L of oxygen at ALF and is currently on 10-15L HFNC + 15L NRB. Pt currently requires setup to max assist for self-care and functional transfer tasks. Pt tolerated sitting edge of bed 22 min while engaging in self-feeding and hygiene task. Pt's oxygen increased to 15L HFNC during activity due to desat with pt reporting increased shortness of breath. SpO2 fluctuated between 70%-92% during activity with HR 115 - 160 bpm. Pt unable to stand this date due to SOB and fatigue. Educated pt on energy conservation, activity modifications, and safety strategies with fair understanding. Educated pt on pursed lip breathing and relaxation strategies to address anxiety related to shortness of breath. 3/4 DOE. Pt demonstrates decreased ROM, strength, endurance, balance, standing tolerance, and activity tolerance impacting ability to complete self-care and functional transfer tasks. Recommend skilled OT services to address above deficits in order to promote function and prevent further decline. Recommend SNF placement for additional rehab prior to discharge back to ALF.  Pt back on 10L HFNC +15L NRB at end of session with SpO2 at 94%.    Follow Up Recommendations  SNF     Equipment Recommendations  Other (comment)(TBD at next venue of care)    Recommendations for Other Services       Precautions / Restrictions Precautions Precautions: Fall;Other (comment) Precaution Comments: 10-15L HFNC + 15L NRB Restrictions Weight Bearing Restrictions: No      Mobility Bed Mobility Overal bed mobility: Needs Assistance Bed Mobility: Supine to Sit;Sit to Supine     Supine to sit: Mod assist;HOB elevated Sit to supine: Mod assist;HOB elevated   General bed mobility comments: Assist with trunk and BLEs.   Transfers Overall transfer level: Needs assistance               General transfer comment: Not formally assessed. Pt too fatigued to stand this date.     Balance Overall balance assessment: Needs assistance   Sitting balance-Leahy Scale: Fair                                     ADL either performed or assessed with clinical judgement   ADL Overall ADL's : Needs assistance/impaired Eating/Feeding: Set up;Sitting   Grooming: Supervision/safety;Set up;Sitting   Upper Body Bathing: Minimal assistance;Sitting   Lower Body Bathing: Moderate assistance;Maximal assistance;Sitting/lateral leans;Sit to/from stand   Upper Body Dressing : Minimal assistance;Moderate assistance;Sitting   Lower Body Dressing: Maximal assistance;Sitting/lateral leans;Sit to/from stand   Toilet Transfer: Moderate assistance;BSC   Toileting- Clothing Manipulation and Hygiene: Moderate assistance;Maximal assistance;Sitting/lateral lean;Sit to/from stand       Functional mobility during ADLs: (Not formally assessed. Pt too fatigued to stand this date)  Vision         Perception     Praxis      Pertinent Vitals/Pain Pain Assessment: No/denies pain     Hand Dominance Right   Extremity/Trunk Assessment Upper Extremity Assessment Upper Extremity Assessment: LUE deficits/detail;Generalized weakness(RUE ROM WFL) LUE Deficits / Details:  Shld flex ~45 degrees. Elbow, wrist, and digit ROM WFL   Lower Extremity Assessment Lower Extremity Assessment: Defer to PT evaluation       Communication Communication Communication: No difficulties   Cognition Arousal/Alertness: Lethargic Behavior During Therapy: WFL for tasks assessed/performed Overall Cognitive Status: No family/caregiver present to determine baseline cognitive functioning                                     General Comments  10L HFNC +15L NRB at rest with O2 SATs in low 90s. Per RN, MD has requested an SpO2 goal of 80%-88%. Pt required 15L HFNC + 15L NRB during activity with SpO2 fluctuating between 70% and 92%. HR fluctuated between 115 - 160bpm.    Exercises Exercises: Other exercises Other Exercises Other Exercises: Pursed lip breathing throughout with mod cues on technique.   Shoulder Instructions      Home Living Family/patient expects to be discharged to:: Assisted living                             Home Equipment: Walker - 2 wheels          Prior Functioning/Environment Level of Independence: Needs assistance  Gait / Transfers Assistance Needed: Pt reports ambulating independently with RW ADL's / Homemaking Assistance Needed: Pt reports being independent in all self-care tasks requiring assist for IADLs only.            OT Problem List: Decreased strength;Decreased range of motion;Decreased activity tolerance;Impaired balance (sitting and/or standing);Decreased knowledge of use of DME or AE;Cardiopulmonary status limiting activity      OT Treatment/Interventions: Self-care/ADL training;Therapeutic exercise;Neuromuscular education;Energy conservation;DME and/or AE instruction;Therapeutic activities;Patient/family education;Balance training    OT Goals(Current goals can be found in the care plan section) Acute Rehab OT Goals Patient Stated Goal: To go back to ALF Time For Goal Achievement: 05/02/19 Potential to  Achieve Goals: Good ADL Goals Pt Will Perform Grooming: with set-up;sitting Pt Will Perform Upper Body Bathing: with set-up;sitting Pt Will Perform Lower Body Dressing: with min assist;sit to/from stand Pt Will Transfer to Toilet: with min assist;bedside commode Pt Will Perform Toileting - Clothing Manipulation and hygiene: with min assist;sit to/from stand Additional ADL Goal #1: Pt to recall and demonstrate breathing exercises with 0 verbal cues on technique.  OT Frequency: Min 2X/week   Barriers to D/C:            Co-evaluation              AM-PAC OT "6 Clicks" Daily Activity     Outcome Measure Help from another person eating meals?: A Little Help from another person taking care of personal grooming?: A Little Help from another person toileting, which includes using toliet, bedpan, or urinal?: A Lot Help from another person bathing (including washing, rinsing, drying)?: A Lot Help from another person to put on and taking off regular upper body clothing?: A Lot Help from another person to put on and taking off regular lower body clothing?: A Lot 6 Click Score: 14   End of Session  Equipment Utilized During Treatment: Oxygen Nurse Communication: Mobility status  Activity Tolerance: Patient limited by fatigue;Patient limited by lethargy(Limited by SOB) Patient left: in bed;with call bell/phone within reach  OT Visit Diagnosis: Unsteadiness on feet (R26.81);Muscle weakness (generalized) (M62.81)                Time: 4709-6283 OT Time Calculation (min): 50 min Charges:  OT General Charges $OT Visit: 1 Visit OT Evaluation $OT Eval Moderate Complexity: 1 Mod OT Treatments $Self Care/Home Management : 8-22 mins $Therapeutic Activity: 8-22 mins  Peterson Ao OTR/L [phone]   Peterson Ao 04/18/2019, 1:53 PM

## 2019-04-18 NOTE — Progress Notes (Signed)
Peripherally Inserted Central Catheter/Midline Placement  The IV Nurse has discussed with the patient and/or persons authorized to consent for the patient, the purpose of this procedure and the potential benefits and risks involved with this procedure.  The benefits include less needle sticks, lab draws from the catheter, and the patient may be discharged home with the catheter. Risks include, but not limited to, infection, bleeding, blood clot (thrombus formation), and puncture of an artery; nerve damage and irregular heartbeat and possibility to perform a PICC exchange if needed/ordered by physician.  Alternatives to this procedure were also discussed.  Bard Power PICC patient education guide, fact sheet on infection prevention and patient information card has been provided to patient /or left at bedside.    PICC/Midline Placement Documentation  PICC Single Lumen 04/18/19 PICC Right Brachial 41 cm 0 cm (Active)  Indication for Insertion or Continuance of Line Prolonged intravenous therapies 04/18/19 1800  Exposed Catheter (cm) 0 cm 04/18/19 1800  Site Assessment Clean;Dry;Intact 04/18/19 1800  Line Status Flushed;Blood return noted 04/18/19 1800  Dressing Type Transparent;Securing device 04/18/19 1800  Dressing Status Clean;Dry;Intact;Antimicrobial disc in place 04/18/19 1800  Harmony checked and tightened;Tubing changed 04/18/19 1800  Line Adjustment (NICU/IV Team Only) No 04/18/19 1800  Dressing Intervention New dressing 04/18/19 1800  Dressing Change Due 04/25/19 04/18/19 1800       Rolena Infante 04/18/2019, 7:41 PM

## 2019-04-18 NOTE — Progress Notes (Signed)
Due to her low PCT, ok to dc doxy and change her Pradaxa to 150mg  BID because her renal function is normal. (Dr. Raelyn Mora)  Dc doxy Change Pradaxa to 150mg  PO BID  Onnie Boer, PharmD, BCIDP, AAHIVP, CPP Infectious Disease Pharmacist 04/18/2019 1:43 PM

## 2019-04-18 NOTE — NC FL2 (Signed)
[place] MEDICAID FL2 LEVEL OF CARE SCREENING TOOL     IDENTIFICATION  Patient Name: Diana Brady Birthdate: 08/27/1940 Sex: female Admission Date (Current Location): 2019/05/02  [facility] and [place] Number:  [employer] and Address:  [facility] [place], [address] [place], [place] [address]([facility])      Provider Number: [id number]  Attending Physician Name and Address:  Dorcas Carrow, MD  Relative Name and Phone Number:  Molli Hazard (son) [phone]    Current Level of Care: Hospital Recommended Level of Care: Skilled Nursing Facility Prior Approval Number:    Date Approved/Denied:   PASRR Number: [id number]  Discharge Plan: SNF    Current Diagnoses: Patient Active Problem List   Diagnosis Date Noted  . Pneumonia due to COVID-19 virus 05-02-19  . Acute on chronic respiratory failure with hypoxia (HCC) 05/02/2019  . Hypertension   . Hyperlipidemia   . Atrial fibrillation (HCC)   . Chronic atrial fibrillation (HCC) 01/24/2017  . Dyslipidemia 01/24/2017  . Type 2 diabetes mellitus with complication, without long-term current use of insulin (HCC) 01/24/2017  . Late effects of CVA (cerebrovascular accident) 01/24/2017    Orientation RESPIRATION BLADDER Height & Weight     Self, Time, Situation, Place  O2(HFNC 10 L) Incontinent, External catheter Weight: 170 lb 6.7 oz (77.3 kg) Height:     BEHAVIORAL SYMPTOMS/MOOD NEUROLOGICAL BOWEL NUTRITION STATUS      Continent Diet(see discharge summary)  AMBULATORY STATUS COMMUNICATION OF NEEDS Skin   Extensive Assist Verbally Other (Comment)(MASD abdomen and groin)                       Personal Care Assistance Level of Assistance  Bathing, Feeding, Dressing, Total care Bathing Assistance: Limited assistance Feeding assistance: Independent Dressing Assistance: Limited assistance Total Care Assistance: Maximum assistance   Functional Limitations Info  Sight,  Hearing, Speech Sight Info: Impaired Hearing Info: Adequate Speech Info: Adequate    SPECIAL CARE FACTORS FREQUENCY  PT (By licensed PT), OT (By licensed OT)     PT Frequency: min 5x weekly OT Frequency: min 5x weekly            Contractures Contractures Info: Not present    Additional Factors Info  Code Status, Allergies, Isolation Precautions Code Status Info: full Allergies Info: Codeine, Darvon (propoxyphene), Dilaudid (hydromorphone Hcl), Propoxyphene, sulfa antibiotics, tramadol, vancomycin     Isolation Precautions Info: covid 19 virus infection     Current Medications (04/18/2019):  This is the current hospital active medication list Current Facility-Administered Medications  Medication Dose Route Frequency Provider Last Rate Last Admin  . 0.9 %  sodium chloride infusion (Manually program via Guardrails IV Fluids)   Intravenous Once Dorcas Carrow, MD      . acetaminophen (TYLENOL) tablet 650 mg  650 mg Oral Q6H PRN Coletta Memos, MD      . chlorpheniramine-HYDROcodone (TUSSIONEX) 10-8 MG/5ML suspension 5 mL  5 mL Oral Q12H PRN Coletta Memos, MD   5 mL at 04/18/19 0113  . dabigatran (PRADAXA) capsule 75 mg  75 mg Oral Q12H Coletta Memos, MD   75 mg at 04/17/19 2134  . dexamethasone (DECADRON) tablet 6 mg  6 mg Oral Q24H Coletta Memos, MD   6 mg at 04/17/19 0834  . dextrose 50 % solution 0-50 mL  0-50 mL Intravenous PRN Dorcas Carrow, MD      . docusate sodium (COLACE) capsule 100 mg  100 mg Oral Daily Vera,  Trinity, MD   100 mg at 04/17/19 0836  . doxycycline (VIBRAMYCIN) 100 mg in sodium chloride 0.9 % 250 mL IVPB  100 mg Intravenous Q12H Peyton Bottoms, MD   Stopped at 04/17/19 2300  . guaiFENesin-dextromethorphan (ROBITUSSIN DM) 100-10 MG/5ML syrup 10 mL  10 mL Oral Q4H PRN Peyton Bottoms, MD      . insulin aspart (novoLOG) injection 0-20 Units  0-20 Units Subcutaneous Q4H Ghimire, Kuber, MD      . insulin detemir (LEVEMIR) injection 30 Units  30 Units Subcutaneous  BID Barb Merino, MD      . insulin regular, human (MYXREDLIN) 100 units/ 100 mL infusion   Intravenous Continuous Barb Merino, MD 1.9 mL/hr at 04/18/19 0646 1.9 Units/hr at 04/18/19 0646  . Ipratropium-Albuterol (COMBIVENT) respimat 1 puff  1 puff Inhalation Q6H Peyton Bottoms, MD   1 puff at 04/18/19 0300  . nystatin (MYCOSTATIN/NYSTOP) topical powder   Topical TID Peyton Bottoms, MD   Given at 04/17/19 2135  . ondansetron (ZOFRAN) injection 4 mg  4 mg Intravenous Q6H PRN Barb Merino, MD   4 mg at 05/01/19 1223  . potassium PHOSPHATE 20 mmol in dextrose 5 % 500 mL infusion  20 mmol Intravenous Once Barb Merino, MD      . remdesivir 100 mg in sodium chloride 0.9 % 100 mL IVPB  100 mg Intravenous Daily Peyton Bottoms, MD   Stopped at 04/17/19 [phone]  . vitamin C (ASCORBIC ACID) tablet 500 mg  500 mg Oral Daily Peyton Bottoms, MD   500 mg at 04/17/19 0835  . zinc sulfate capsule 220 mg  220 mg Oral Daily Peyton Bottoms, MD   220 mg at 04/17/19 5916     Discharge Medications: Please see discharge summary for a list of discharge medications.  Relevant Imaging Results:  Relevant Lab Results:   Additional Information SSN: [id number]  Alberteen Sam, LCSW

## 2019-04-18 NOTE — Progress Notes (Signed)
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  Patient also complained of decreased appetite.  Patient had visited emergency room twice.  Patient was complaining of not feeling well and generalized weakness.  In the emergency room, she needed 4 to 6 L of oxygen to keep saturation more than 90%.  Chest x-ray showed bilateral infiltrates and interstitial edema.  Repeat COVID-19 was positive.  She was admitted to [facility] to treat for COVID-19 related pneumonia.   Assessment & Plan:   Principal Problem:   Acute on chronic respiratory failure with hypoxia (HCC) Active Problems:   Type 2 diabetes mellitus with complication, without long-term current use of insulin (HCC)   Pneumonia due to COVID-19 virus   Hypertension   Hyperlipidemia   Atrial fibrillation (HCC)  Pneumonia due to COVID-19 virus, acute on chronic hypoxic respiratory failure: Continue to monitor due to significant symptoms and increasing oxygen demand. chest physiotherapy, incentive spirometry, deep breathing exercises, sputum induction, mucolytic's and bronchodilators. Supplemental oxygen to keep saturations more than 89 %. Covid directed therapy with , steroids, dexamethasone day 4/10 remdesivir, day 4/5 actemra, a single dose given on January 29, 2019 after consent.   Patient received 1 unit of convalescent plasma on January 29, 2019. antibiotics, procalcitonin elevated 0.17.  1 dose of Levaquin in the emergency room, currently on doxycycline.  We will continue. Due to severity of symptoms, patient will need daily inflammatory markers, chest x-rays, liver  function test to monitor and direct COVID-19 therapies. We will start diuresing as her renal functions are normal.  Type 2 diabetes mellitus with long-term insulin use: Hyperglycemia.  Patient had blood sugar more than 600 last night.  Treated with IV insulin overnight.  Blood sugars persistently less than 100 now. If she eats adequate, will start on long-acting insulin and resistant sliding scale.   Paroxysmal atrial fibrillation: Currently in A. fib . Rate controlled.  She is on Pradaxa that was continued.  Hypokalemia: Replaced with improvement.  Hypophosphatemia: Replace and monitor levels.  DVT prophylaxis: Pradaxa Code Status: Full code Family Communication: call placed to son.  Generic voicemail left to [phone] with no identifiable data. Disposition Plan: Remains active inpatient treatment in the hospital.    Consultants:   None  Procedures:   None  Antimicrobials:   Doxycycline, January 29, 2019----  Remdesivir, 04/15/2019, [facility]----   Subjective:  Patient seen and examined.  Overnight events noted.  Blood sugar stable on IV insulin.  Patient herself denies any complaints.  Remains lethargic and sleepy.  Patient is on 100% nonrebreather as well as high flow nasal cannula oxygen.  Objective: Vitals:   04/17/19 1700 04/17/19 2025 04/18/19 0335 04/18/19 0800  BP: (!) 118/95 (!) 154/84 (!) 151/80 (!) 159/72  Pulse: 100 (!) 107 94 92  Resp: (!) 22 (!) 23 18 20   Temp:  98.2 F (36.8 C) 98.1 F (36.7 C) 97.9 F (36.6 C)  TempSrc:  Oral Axillary Axillary  SpO2: (!) 88% 91% 90% (!) 89%  Weight:        Intake/Output Summary (Last 24 hours) at 04/18/2019 1132 Last data filed at 04/18/2019 0935 Gross per 24 hour  Intake 1310  ml  Output 1500 ml  Net -190 ml   Filed Weights   05-16-19 1703  Weight: 77.3 kg    Vitals:   04/17/19 1700 04/17/19 2025 04/18/19 0335 04/18/19 0800  BP: (!) 118/95 (!) 154/84 (!) 151/80 (!) 159/72  Pulse: 100 (!) 107  94 92  Temp:  98.2 F (36.8 C) 98.1 F (36.7 C) 97.9 F (36.6 C)  Resp: (!) 22 (!) 23 18 20   Weight:      SpO2: (!) 88% 91% 90% (!) 89%  TempSrc:  Oral Axillary Axillary    Examination:  General exam: Appears calm and comfortable , chronically sick looking.  Lethargic sleepy on high flow oxygen.  She wakes up and able to keep up conversation on stimulation. Respiratory system: Clear to auscultation. Respiratory effort normal.  Mostly conducted airway sounds.  No added sounds. Cardiovascular system: S1 & S2 heard, RRR. No JVD, murmurs, rubs, gallops or clicks. No pedal edema. Gastrointestinal system: Abdomen is nondistended, soft and nontender. No organomegaly or masses felt. Normal bowel sounds heard. Central nervous system: Alert and oriented. No focal neurological deficits. Extremities: Symmetric 5 x 5 power. Skin: No rashes, lesions or ulcers Psychiatry: Judgement and insight appear normal. Mood & affect flat and anxious.    Data Reviewed: I have personally reviewed following labs and imaging studies  CBC: Recent Labs  Lab May 16, 2019 0732 04/17/19 0159 04/18/19 0303  WBC 9.4 8.0 15.2*  NEUTROABS 8.7* 7.3 13.5*  HGB 9.7* 10.0* 9.9*  HCT 31.8* 33.2* 32.7*  MCV 81.7 83.2 84.9  PLT 133* 135* 163   Basic Metabolic Panel: Recent Labs  Lab 16-May-2019 0732 04/17/19 0159 04/17/19 1725 04/18/19 0303  NA 135 138 136 139  K 3.2* 4.0 4.2 3.7  CL 96* 101 101 104  CO2 24 25 23 25   GLUCOSE 277* 286* 612* 192*  BUN 26* 31* 33* 34*  CREATININE 1.00 0.92 1.00 0.73  CALCIUM 8.0* 8.4* 8.4* 8.8*  MG  --  1.9  --  1.8  PHOS  --  2.6  --  1.9*   GFR: CrCl cannot be calculated (Unknown ideal weight.). Liver Function Tests: Recent Labs  Lab 05-16-2019 0732 04/17/19 0159 04/18/19 0303  AST 24 22 25   ALT 14 12 14   ALKPHOS 63 62 68  BILITOT 1.2 1.0 0.9  PROT 6.1* 6.2* 6.0*  ALBUMIN 2.8* 2.7* 2.9*   No results for input(s): LIPASE, AMYLASE in the last 168 hours. No results for  input(s): AMMONIA in the last 168 hours. Coagulation Profile: No results for input(s): INR, PROTIME in the last 168 hours. Cardiac Enzymes: No results for input(s): CKTOTAL, CKMB, CKMBINDEX, TROPONINI in the last 168 hours. BNP (last 3 results) No results for input(s): PROBNP in the last 8760 hours. HbA1C: Recent Labs    05-16-2019 0732  HGBA1C 8.1*   CBG: Recent Labs  Lab 04/18/19 0450 04/18/19 0548 04/18/19 0643 04/18/19 0730 04/18/19 0844  GLUCAP 120* 99 123* 89 85   Lipid Profile: No results for input(s): CHOL, HDL, LDLCALC, TRIG, CHOLHDL, LDLDIRECT in the last 72 hours. Thyroid Function Tests: No results for input(s): TSH, T4TOTAL, FREET4, T3FREE, THYROIDAB in the last 72 hours. Anemia Panel: Recent Labs    04/17/19 0159 04/18/19 0303  FERRITIN 2,243* 2,701*   Sepsis Labs: Recent Labs  Lab 2019-05-16 0732  PROCALCITON 0.17    Recent Results (from the past 240 hour(s))  Culture, blood (Routine X 2) w Reflex to ID Panel     Status:  None (Preliminary result)   Collection Time: May 06, 2019  7:25 AM   Specimen: BLOOD  Result Value Ref Range Status   Specimen Description   Final    BLOOD RIGHT ASSIST CONTROL Performed at [facility] [address], [place], [place] [address]    Special Requests   Final    BOTTLES DRAWN AEROBIC ONLY Blood Culture adequate volume Performed at [facility] [address] [place], [place] [address]    Culture   Final    NO GROWTH 2 DAYS Performed at [facility] [address], [place], [place] [address]    Report Status PENDING  Incomplete  Culture, blood (Routine X 2) w Reflex to ID Panel     Status: None (Preliminary result)   Collection Time: 05/06/19  7:32 AM   Specimen: BLOOD  Result Value Ref Range Status   Specimen Description   Final    BLOOD RIGHT ARM Performed at [facility] [address] [place], [place] [address]    Special Requests   Final     BOTTLES DRAWN AEROBIC ONLY Blood Culture adequate volume Performed at [facility] [address] [place], [place] [address]    Culture   Final    NO GROWTH 2 DAYS Performed at [facility] [address] [place], [place] [address]    Report Status PENDING  Incomplete         Radiology Studies: DG CHEST PORT 1 VIEW  Result Date: 04/17/2019 CLINICAL DATA:  Follow-up pneumonia. EXAM: PORTABLE CHEST 1 VIEW COMPARISON:  01/20/202019. FINDINGS: Hazy bilateral airspace lung opacities are noted, similar to prior exams allowing for differences in radiographic technique and patient positioning. No convincing new lung abnormalities. No visualized pleural effusion.  No pneumothorax. IMPRESSION: 1. Bilateral hazy airspace lung opacities consistent with multifocal pneumonia. Findings are compatible with COVID-19 pneumonia. No significant change from most recent prior exam. Electronically Signed   By: Lajean Manes M.D.   On: 04/17/2019 13:27        Scheduled Meds: . sodium chloride   Intravenous Once  . dabigatran  75 mg Oral Q12H  . dexamethasone  6 mg Oral Q24H  . docusate sodium  100 mg Oral Daily  . furosemide  40 mg Intravenous Once  . insulin aspart  0-20 Units Subcutaneous Q4H  . insulin detemir  30 Units Subcutaneous BID  . Ipratropium-Albuterol  1 puff Inhalation Q6H  . nystatin   Topical TID  . vitamin C  500 mg Oral Daily  . zinc sulfate  220 mg Oral Daily   Continuous Infusions: . doxycycline (VIBRAMYCIN) IV Stopped (04/17/19 2300)  . insulin Stopped (04/18/19 0740)  . potassium PHOSPHATE IVPB (in mmol)    . remdesivir 100 mg in NS 100 mL 100 mg (04/18/19 0935)     LOS: 2 days    Time spent: 30 minutes    Barb Merino, MD [facility] Pager [phone]

## 2019-04-19 LAB — COMPREHENSIVE METABOLIC PANEL
ALT: 14 U/L (ref 0–44)
AST: 21 U/L (ref 15–41)
Albumin: 2.6 g/dL — ABNORMAL LOW (ref 3.5–5.0)
Alkaline Phosphatase: 64 U/L (ref 38–126)
Anion gap: 12 (ref 5–15)
BUN: 39 mg/dL — ABNORMAL HIGH (ref 8–23)
CO2: 25 mmol/L (ref 22–32)
Calcium: 8.4 mg/dL — ABNORMAL LOW (ref 8.9–10.3)
Chloride: 101 mmol/L (ref 98–111)
Creatinine, Ser: 0.87 mg/dL (ref 0.44–1.00)
GFR calc Af Amer: >60 mL/min (ref >60)
GFR calc non Af Amer: >60 mL/min (ref >60)
Glucose, Bld: 139 mg/dL — ABNORMAL HIGH (ref 70–99)
Potassium: 4 mmol/L (ref 3.5–5.1)
Sodium: 138 mmol/L (ref 135–145)
Total Bilirubin: 0.7 mg/dL (ref 0.3–1.2)
Total Protein: 5.4 g/dL — ABNORMAL LOW (ref 6.5–8.1)

## 2019-04-19 LAB — CBC WITH DIFFERENTIAL/PLATELET
Abs Immature Granulocytes: 0.4 10*3/uL — ABNORMAL HIGH (ref 0.00–0.07)
Basophils Absolute: 0 10*3/uL (ref 0.0–0.1)
Basophils Relative: 0 %
Eosinophils Absolute: 0 10*3/uL (ref 0.0–0.5)
Eosinophils Relative: 0 %
HCT: 32.8 % — ABNORMAL LOW (ref 36.0–46.0)
Hemoglobin: 9.8 g/dL — ABNORMAL LOW (ref 12.0–15.0)
Immature Granulocytes: 3 %
Lymphocytes Relative: 4 %
Lymphs Abs: 0.5 10*3/uL — ABNORMAL LOW (ref 0.7–4.0)
MCH: 25.3 pg — ABNORMAL LOW (ref 26.0–34.0)
MCHC: 29.9 g/dL — ABNORMAL LOW (ref 30.0–36.0)
MCV: 84.5 fL (ref 80.0–100.0)
Monocytes Absolute: 1.1 10*3/uL — ABNORMAL HIGH (ref 0.1–1.0)
Monocytes Relative: 8 %
Neutro Abs: 12.1 10*3/uL — ABNORMAL HIGH (ref 1.7–7.7)
Neutrophils Relative %: 85 %
Platelets: 207 10*3/uL (ref 150–400)
RBC: 3.88 MIL/uL (ref 3.87–5.11)
RDW: 16 % — ABNORMAL HIGH (ref 11.5–15.5)
WBC: 14.2 10*3/uL — ABNORMAL HIGH (ref 4.0–10.5)
nRBC: 0.1 % (ref 0.0–0.2)

## 2019-04-19 LAB — GLUCOSE, CAPILLARY
Glucose-Capillary: 126 mg/dL — ABNORMAL HIGH (ref 70–99)
Glucose-Capillary: 351 mg/dL — ABNORMAL HIGH (ref 70–99)
Glucose-Capillary: 73 mg/dL (ref 70–99)
Glucose-Capillary: 212 mg/dL — ABNORMAL HIGH (ref 70–99)
Glucose-Capillary: 237 mg/dL — ABNORMAL HIGH (ref 70–99)
Glucose-Capillary: 396 mg/dL — ABNORMAL HIGH (ref 70–99)

## 2019-04-19 LAB — PREPARE FRESH FROZEN PLASMA

## 2019-04-19 LAB — FERRITIN: Ferritin: 1317 ng/mL — ABNORMAL HIGH (ref 11–307)

## 2019-04-19 LAB — BPAM FFP
Blood Product Expiration Date: 202012130025
ISSUE DATE / TIME: 202012120119
Unit Type and Rh: 8400

## 2019-04-19 LAB — MAGNESIUM: Magnesium: 1.7 mg/dL (ref 1.7–2.4)

## 2019-04-19 LAB — PHOSPHORUS: Phosphorus: 3.9 mg/dL (ref 2.5–4.6)

## 2019-04-19 LAB — C-REACTIVE PROTEIN: CRP: 4.5 mg/dL — ABNORMAL HIGH (ref <1.0)

## 2019-04-19 MED ORDER — LEVOTHYROXINE SODIUM 25 MCG PO TABS
25.0000 ug | ORAL_TABLET | Freq: Every day | ORAL | Status: DC
  Administered 2019-04-19: 25 ug via ORAL
  Filled 2019-04-19 (×2): qty 1

## 2019-04-19 MED ORDER — MELATONIN 3 MG PO TABS
6.0000 mg | ORAL_TABLET | Freq: Every day | ORAL | Status: DC
  Administered 2019-04-19 (×2): 6 mg via ORAL
  Filled 2019-04-19 (×2): qty 2

## 2019-04-19 MED ORDER — CLONIDINE HCL 0.1 MG PO TABS: 0.1000 mg | ORAL_TABLET | Freq: Every day | ORAL | Status: DC | PRN

## 2019-04-19 MED ORDER — UMECLIDINIUM BROMIDE 62.5 MCG/INH IN AEPB
2.0000 | INHALATION_SPRAY | Freq: Every day | RESPIRATORY_TRACT | Status: DC
  Administered 2019-04-19: 2 via RESPIRATORY_TRACT
  Filled 2019-04-19: qty 7

## 2019-04-19 MED ORDER — MONTELUKAST SODIUM 10 MG PO TABS
10.0000 mg | ORAL_TABLET | Freq: Every day | ORAL | Status: DC
  Administered 2019-04-19 (×2): 10 mg via ORAL
  Filled 2019-04-19 (×2): qty 1

## 2019-04-19 MED ORDER — PAROXETINE HCL 20 MG PO TABS
40.0000 mg | ORAL_TABLET | ORAL | Status: DC
  Administered 2019-04-19: 40 mg via ORAL
  Filled 2019-04-19 (×2): qty 2

## 2019-04-19 MED ORDER — PANTOPRAZOLE SODIUM 40 MG PO TBEC
40.0000 mg | DELAYED_RELEASE_TABLET | Freq: Every day | ORAL | Status: DC
  Administered 2019-04-19: 09:00:00 40 mg via ORAL
  Filled 2019-04-19: qty 1

## 2019-04-19 MED ORDER — ATORVASTATIN CALCIUM 40 MG PO TABS
40.0000 mg | ORAL_TABLET | Freq: Every day | ORAL | Status: DC
  Administered 2019-04-19: 09:00:00 40 mg via ORAL
  Filled 2019-04-19: qty 1

## 2019-04-19 MED ORDER — FAMOTIDINE IN NACL 20-0.9 MG/50ML-% IV SOLN
20.0000 mg | Freq: Two times a day (BID) | INTRAVENOUS | Status: DC
  Administered 2019-04-19 (×2): 20 mg via INTRAVENOUS
  Filled 2019-04-19 (×2): qty 50

## 2019-04-19 MED ORDER — ASPIRIN EC 325 MG PO TBEC
325.0000 mg | DELAYED_RELEASE_TABLET | Freq: Every day | ORAL | Status: DC
  Administered 2019-04-19: 09:00:00 325 mg via ORAL
  Filled 2019-04-19 (×2): qty 1

## 2019-04-19 MED ORDER — MOMETASONE FURO-FORMOTEROL FUM 200-5 MCG/ACT IN AERO
2.0000 | INHALATION_SPRAY | Freq: Two times a day (BID) | RESPIRATORY_TRACT | Status: DC
  Administered 2019-04-19 (×3): 2 via RESPIRATORY_TRACT
  Filled 2019-04-19: qty 8.8

## 2019-04-19 MED ORDER — HYDRALAZINE HCL 25 MG PO TABS
50.0000 mg | ORAL_TABLET | Freq: Four times a day (QID) | ORAL | Status: DC
  Administered 2019-04-19 (×4): 50 mg via ORAL
  Filled 2019-04-19 (×4): qty 2

## 2019-04-19 MED ORDER — ALUM & MAG HYDROXIDE-SIMETH 200-200-20 MG/5ML PO SUSP
15.0000 mL | Freq: Four times a day (QID) | ORAL | Status: DC | PRN
  Administered 2019-04-19: 11:00:00 15 mL via ORAL
  Filled 2019-04-19 (×2): qty 30

## 2019-04-19 MED ORDER — METOPROLOL TARTRATE 25 MG PO TABS
100.0000 mg | ORAL_TABLET | Freq: Two times a day (BID) | ORAL | Status: DC
  Administered 2019-04-19 (×3): 100 mg via ORAL
  Filled 2019-04-19 (×3): qty 2

## 2019-04-19 NOTE — Plan of Care (Signed)
Pt slept well during the night. No complaints of pain verbalized. Alert and oriented. Vitals stable, maintained on 10L HFNC and NRB mask - unable to wean further, pt destaurates to the 70s as soon as NRB mask is removed. Continues to have dyspnea on exertion and non-productive cough  prn Robitussin and Tussionex given given. Moderate assist with ADLs. Purewick draining well.  IV and newly inserted PICC. SL. Assisted regular repositioning for pressure relief. Unable to tolerate prone positioning  overnight. Blood glucose better controlled this AM. No other issues, will monitor.   Problem: Respiratory: Goal: Will maintain a patent airway Outcome: Progressing Goal: Complications related to the disease process, condition or treatment will be avoided or minimized Outcome: Progressing   Problem: Coping: Goal: Psychosocial and spiritual needs will be supported Outcome: Progressing   Problem: Skin Integrity: Goal: Risk for impaired skin integrity will decrease Outcome: Progressing   Problem: Activity: Goal: Risk for activity intolerance will decrease Outcome: Progressing

## 2019-04-19 NOTE — Progress Notes (Signed)
Paged Dr. Shanon Brow re: patient having another medium sized melena bloody stool this evening. Waiting call back or orders. Will continue to monitor patient.

## 2019-04-19 NOTE — Progress Notes (Signed)
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  Patient also complained of decreased appetite.  Patient had visited emergency room twice.  Patient was complaining of not feeling well and generalized weakness.  In the emergency room, she needed 4 to 6 L of oxygen to keep saturation more than 90%.  Chest x-ray showed bilateral infiltrates and interstitial edema.  Repeat COVID-19 was positive.  She was admitted to [facility] to treat for COVID-19 related pneumonia.   Assessment & Plan:   Principal Problem:   Acute on chronic respiratory failure with hypoxia (HCC) Active Problems:   Type 2 diabetes mellitus with complication, without long-term current use of insulin (HCC)   Pneumonia due to COVID-19 virus   Hypertension   Hyperlipidemia   Atrial fibrillation ([facility])  Pneumonia due to COVID-19 virus, acute on chronic hypoxic respiratory failure: Continue to monitor due to significant symptoms.  Patient is still on significant oxygen. chest physiotherapy, incentive spirometry, deep breathing exercises, sputum induction, mucolytic's and bronchodilators. Supplemental oxygen to keep saturations more than 85 %. Covid directed therapy with , steroids, dexamethasone day 5/10 remdesivir, day 5/5 actemra, a single dose given on 04/14/2019 after consent.   Patient received 1 unit of convalescent plasma on 04/30/2019. antibiotics, procalcitonin elevated 0.17.  1 dose of Levaquin in the emergency room, then treated with doxycycline.  No evidence of bacterial infection.  Discontinued.  Due to severity of symptoms, patient will need  daily inflammatory markers, chest x-rays, liver function test to monitor and direct COVID-19 therapies. Repeat chest x-ray 04/18/2019 with improved aeration.  Type 2 diabetes mellitus with long-term insulin use: Hyperglycemia.  Patient with widely fluctuating blood sugars.  As high as 600.  Treated with IV insulin and now trying to manage with subcu insulin.  Continue to titrate as per her blood sugar levels.    Paroxysmal atrial fibrillation: Currently in A. fib . Rate controlled.  She is on Pradaxa that was continued with increased to regular dose with normal renal function.  Hypokalemia: Replaced with improvement.  Hypophosphatemia: Replaced and monitor levels.  DVT prophylaxis: Pradaxa Code Status: Full code Family Communication: call placed to son. Today again unable to get through this number , [phone] with no identifiable data. I called his office at [employer] and left him a message to call back. Disposition Plan: Remains active inpatient treatment in the hospital.    Consultants:   None  Procedures:   None  Antimicrobials:   Doxycycline, 04/11/2019----04/18/2019  Remdesivir, 04/15/2019>>>   Subjective:  Patient seen and examined.  No overnight events.  Remains on 15 L nonrebreather as well as 8 to 10 L through the high flow nasal cannula oxygen.  Herself feels weak otherwise denies any shortness of breath at rest.  Today she felt some epigastric discomfort.  No nausea or vomiting.  Nursing reported small amount of blood with bowel movement, will monitor.  Objective: Vitals:   04/19/19 0353 04/19/19 0630 04/19/19 0702 04/19/19 0803  BP: 117/67   124/63  Pulse: 65   76  Resp: (!) 21 19 20  (!) 24  Temp: 97.6 F (36.4 C)   98.8 F (  37.1 C)  TempSrc: Oral   Axillary  SpO2: 94% (!) 87% 90% (!) 88%  Weight:      Height:        Intake/Output Summary (Last 24 hours) at 04/19/2019 1053 Last data filed at 04/19/2019 0400 Gross per 24 hour  Intake 1733.09 ml    Output 2625 ml  Net -891.91 ml   Filed Weights   04/18/2019 1703  Weight: 77.3 kg    Vitals:   04/19/19 0353 04/19/19 0630 04/19/19 0702 04/19/19 0803  BP: 117/67   124/63  Pulse: 65   76  Temp: 97.6 F (36.4 C)   98.8 F (37.1 C)  Resp: (!) 21 19 20  (!) 24  Height:      Weight:      SpO2: 94% (!) 87% 90% (!) 88%  TempSrc: Oral   Axillary    Examination:  General exam: Appears calm and comfortable on high flow oxygen.  Chronically sick looking.  Not in any acute distress now. Respiratory system: Clear to auscultation. Respiratory effort normal.  Mostly conducted airway sounds.  No added sounds. Cardiovascular system: S1 & S2 heard, irregularly irregular, No JVD, murmurs, rubs, gallops or clicks. No pedal edema. Gastrointestinal system: Abdomen is nondistended, soft and nontender. No organomegaly or masses felt. Normal bowel sounds heard. Central nervous system: Alert and oriented. No focal neurological deficits. Extremities: Symmetric 5 x 5 power. Skin: No rashes, lesions or ulcers Psychiatry: Judgement and insight appear normal. Mood & affect flat and anxious.    Data Reviewed: I have personally reviewed following labs and imaging studies  CBC: Recent Labs  Lab 04/13/2019 0732 04/17/19 0159 04/18/19 0303 04/19/19 0325  WBC 9.4 8.0 15.2* 14.2*  NEUTROABS 8.7* 7.3 13.5* 12.1*  HGB 9.7* 10.0* 9.9* 9.8*  HCT 31.8* 33.2* 32.7* 32.8*  MCV 81.7 83.2 84.9 84.5  PLT 133* 135* 163 207   Basic Metabolic Panel: Recent Labs  Lab 04/15/2019 0732 04/17/19 0159 04/17/19 1725 04/18/19 0303 04/19/19 0325  NA 135 138 136 139 138  K 3.2* 4.0 4.2 3.7 4.0  CL 96* 101 101 104 101  CO2 24 25 23 25 25   GLUCOSE 277* 286* 612* 192* 139*  BUN 26* 31* 33* 34* 39*  CREATININE 1.00 0.92 1.00 0.73 0.87  CALCIUM 8.0* 8.4* 8.4* 8.8* 8.4*  MG  --  1.9  --  1.8 1.7  PHOS  --  2.6  --  1.9* 3.9   GFR: Estimated Creatinine Clearance: 52.1 mL/min (by C-G formula based on SCr of 0.87  mg/dL). Liver Function Tests: Recent Labs  Lab 04/09/2019 0732 04/17/19 0159 04/18/19 0303 04/19/19 0325  AST 24 22 25 21   ALT 14 12 14 14   ALKPHOS 63 62 68 64  BILITOT 1.2 1.0 0.9 0.7  PROT 6.1* 6.2* 6.0* 5.4*  ALBUMIN 2.8* 2.7* 2.9* 2.6*   No results for input(s): LIPASE, AMYLASE in the last 168 hours. No results for input(s): AMMONIA in the last 168 hours. Coagulation Profile: No results for input(s): INR, PROTIME in the last 168 hours. Cardiac Enzymes: No results for input(s): CKTOTAL, CKMB, CKMBINDEX, TROPONINI in the last 168 hours. BNP (last 3 results) No results for input(s): PROBNP in the last 8760 hours. HbA1C: No results for input(s): HGBA1C in the last 72 hours. CBG: Recent Labs  Lab 04/18/19 1622 04/18/19 2104 04/18/19 2352 04/19/19 0355 04/19/19 0736  GLUCAP 362* 398* 351* 126* 73   Lipid Profile: No results for input(s): CHOL, HDL, LDLCALC, TRIG, CHOLHDL,  LDLDIRECT in the last 72 hours. Thyroid Function Tests: No results for input(s): TSH, T4TOTAL, FREET4, T3FREE, THYROIDAB in the last 72 hours. Anemia Panel: Recent Labs    04/18/19 0303 04/19/19 0325  FERRITIN 2,701* 1,317*   Sepsis Labs: Recent Labs  Lab 04/19/2019 0732  PROCALCITON 0.17    Recent Results (from the past 240 hour(s))  Culture, blood (Routine X 2) w Reflex to ID Panel     Status: None (Preliminary result)   Collection Time: 04/06/2019  7:25 AM   Specimen: BLOOD  Result Value Ref Range Status   Specimen Description   Final    BLOOD RIGHT ASSIST CONTROL Performed at [facility], [address], [place], [place] [address]    Special Requests   Final    BOTTLES DRAWN AEROBIC ONLY Blood Culture adequate volume Performed at [facility], [address], [place], [place] [address]    Culture   Final    NO GROWTH 3 DAYS Performed at [facility], [address], [place], [place] [address]    Report Status PENDING  Incomplete  Culture,  blood (Routine X 2) w Reflex to ID Panel     Status: None (Preliminary result)   Collection Time: 04/18/2019  7:32 AM   Specimen: BLOOD  Result Value Ref Range Status   Specimen Description   Final    BLOOD RIGHT ARM Performed at [facility], [address], [place], [place] [address]    Special Requests   Final    BOTTLES DRAWN AEROBIC ONLY Blood Culture adequate volume Performed at [facility], [address], [place], [place] [address]    Culture   Final    NO GROWTH 3 DAYS Performed at [facility], [address] [place], [place] [address]    Report Status PENDING  Incomplete         Radiology Studies: DG CHEST PORT 1 VIEW  Result Date: 04/18/2019 CLINICAL DATA:  Status post PICC line placement EXAM: PORTABLE CHEST 1 VIEW COMPARISON:  04/17/2019 FINDINGS: Cardiac shadow is stable. New right-sided PICC line is noted with the catheter tip at the cavoatrial junction. The hazy lung opacities have improved somewhat in the interval from the prior exam although persistent bibasilar opacities remain. No bony abnormality is noted. IMPRESSION: Improved aeration as described. New right-sided PICC line in satisfactory position. Electronically Signed   By: Alcide Clever M.D.   On: 04/18/2019 23:34   DG CHEST PORT 1 VIEW  Result Date: 04/17/2019 CLINICAL DATA:  Follow-up pneumonia. EXAM: PORTABLE CHEST 1 VIEW COMPARISON:  04/15/2018. FINDINGS: Hazy bilateral airspace lung opacities are noted, similar to prior exams allowing for differences in radiographic technique and patient positioning. No convincing new lung abnormalities. No visualized pleural effusion.  No pneumothorax. IMPRESSION: 1. Bilateral hazy airspace lung opacities consistent with multifocal pneumonia. Findings are compatible with COVID-19 pneumonia. No significant change from most recent prior exam. Electronically Signed   By: Amie Portland M.D.   On: 04/17/2019 13:27   [place] EKG SITE  RITE  Result Date: 04/18/2019 If Site Rite image not attached, placement could not be confirmed due to current cardiac rhythm.       Scheduled Meds: . sodium chloride   Intravenous Once  . aspirin  325 mg Oral Daily  . atorvastatin  40 mg Oral Daily  . Chlorhexidine Gluconate Cloth  6 each Topical Daily  . dabigatran  150 mg Oral Q12H  .  dexamethasone  6 mg Oral Q24H  . docusate sodium  100 mg Oral Daily  . hydrALAZINE  50 mg Oral QID  . influenza vaccine adjuvanted  0.5 mL Intramuscular Tomorrow-1000  . insulin aspart  0-20 Units Subcutaneous Q4H  . insulin detemir  30 Units Subcutaneous BID  . Ipratropium-Albuterol  1 puff Inhalation Q6H  . levothyroxine  25 mcg Oral Q0600  . Melatonin  6 mg Oral QHS  . metoprolol tartrate  100 mg Oral BID  . mometasone-formoterol  2 puff Inhalation BID  . montelukast  10 mg Oral QHS  . nystatin   Topical TID  . pantoprazole  40 mg Oral Daily  . PARoxetine  40 mg Oral [id number]  . pneumococcal 23 valent vaccine  0.5 mL Intramuscular Tomorrow-1000  . umeclidinium bromide  2 puff Inhalation Daily  . vitamin C  500 mg Oral Daily  . zinc sulfate  220 mg Oral Daily   Continuous Infusions: . famotidine (PEPCID) IV    . insulin Stopped (04/18/19 0740)     LOS: 3 days    Time spent: 30 minutes    Dorcas Carrow, MD [facility] Pager [phone]

## 2019-04-20 DIAGNOSIS — R5381 Other malaise: Secondary | ICD-10-CM | POA: Diagnosis present

## 2019-04-20 DIAGNOSIS — Z7901 Long term (current) use of anticoagulants: Secondary | ICD-10-CM

## 2019-04-20 LAB — POCT I-STAT 7, (LYTES, BLD GAS, ICA,H+H)
Acid-base deficit: 12 mmol/L — ABNORMAL HIGH (ref 0.0–2.0)
Bicarbonate: 19.5 mmol/L — ABNORMAL LOW (ref 20.0–28.0)
Calcium, Ion: 0.99 mmol/L — ABNORMAL LOW (ref 1.15–1.40)
HCT: 26 % — ABNORMAL LOW (ref 36.0–46.0)
Hemoglobin: 8.8 g/dL — ABNORMAL LOW (ref 12.0–15.0)
O2 Saturation: 73 %
Patient temperature: 98.6
Potassium: 4.8 mmol/L (ref 3.5–5.1)
Sodium: 142 mmol/L (ref 135–145)
TCO2: 22 mmol/L (ref 22–32)
pCO2 arterial: 86.3 mmHg (ref 32.0–48.0)
pH, Arterial: 6.963 — CL (ref 7.350–7.450)
pO2, Arterial: 62 mmHg — ABNORMAL LOW (ref 83.0–108.0)

## 2019-04-20 LAB — CBC WITH DIFFERENTIAL/PLATELET
Abs Immature Granulocytes: 4.06 10*3/uL — ABNORMAL HIGH (ref 0.00–0.07)
Basophils Absolute: 0.1 10*3/uL (ref 0.0–0.1)
Basophils Relative: 0 %
Eosinophils Absolute: 0 10*3/uL (ref 0.0–0.5)
Eosinophils Relative: 0 %
HCT: 33.3 % — ABNORMAL LOW (ref 36.0–46.0)
Hemoglobin: 9.6 g/dL — ABNORMAL LOW (ref 12.0–15.0)
Immature Granulocytes: 17 %
Lymphocytes Relative: 16 %
Lymphs Abs: 3.8 10*3/uL (ref 0.7–4.0)
MCH: 25.6 pg — ABNORMAL LOW (ref 26.0–34.0)
MCHC: 28.8 g/dL — ABNORMAL LOW (ref 30.0–36.0)
MCV: 88.8 fL (ref 80.0–100.0)
Monocytes Absolute: 1.3 10*3/uL — ABNORMAL HIGH (ref 0.1–1.0)
Monocytes Relative: 5 %
Neutro Abs: 14.9 10*3/uL — ABNORMAL HIGH (ref 1.7–7.7)
Neutrophils Relative %: 62 %
Platelets: 199 10*3/uL (ref 150–400)
RBC: 3.75 MIL/uL — ABNORMAL LOW (ref 3.87–5.11)
RDW: 16.1 % — ABNORMAL HIGH (ref 11.5–15.5)
WBC: 24.2 10*3/uL — ABNORMAL HIGH (ref 4.0–10.5)
nRBC: 2 % — ABNORMAL HIGH (ref 0.0–0.2)

## 2019-04-20 LAB — BASIC METABOLIC PANEL
Anion gap: 24 — ABNORMAL HIGH (ref 5–15)
BUN: 44 mg/dL — ABNORMAL HIGH (ref 8–23)
CO2: 29 mmol/L (ref 22–32)
Calcium: 8 mg/dL — ABNORMAL LOW (ref 8.9–10.3)
Chloride: 96 mmol/L — ABNORMAL LOW (ref 98–111)
Creatinine, Ser: 1.06 mg/dL — ABNORMAL HIGH (ref 0.44–1.00)
GFR calc Af Amer: 59 mL/min — ABNORMAL LOW (ref >60)
GFR calc non Af Amer: 51 mL/min — ABNORMAL LOW (ref >60)
Glucose, Bld: 521 mg/dL (ref 70–99)
Potassium: 6.6 mmol/L (ref 3.5–5.1)
Sodium: 149 mmol/L — ABNORMAL HIGH (ref 135–145)

## 2019-04-20 LAB — GLUCOSE, CAPILLARY: Glucose-Capillary: 530 mg/dL (ref 70–99)

## 2019-04-20 MED ORDER — SODIUM BICARBONATE 8.4 % IV SOLN: 50.0000 meq | Freq: Once | INTRAVENOUS | Status: DC

## 2019-04-20 MED ORDER — VASOPRESSIN 20 UNIT/ML IV SOLN
0.0300 [IU]/min | INTRAVENOUS | Status: DC
  Filled 2019-04-20: qty 2

## 2019-04-20 MED ORDER — NOREPINEPHRINE 4 MG/250ML-% IV SOLN
INTRAVENOUS | Status: AC
  Filled 2019-04-20: qty 250

## 2019-04-20 MED ORDER — CALCIUM CHLORIDE 10 % IV SOLN: 1.0000 g | Freq: Once | INTRAVENOUS | Status: DC

## 2019-04-20 MED ORDER — NOREPINEPHRINE 4 MG/250ML-% IV SOLN: 0.0000 ug/min | INTRAVENOUS | Status: DC

## 2019-04-20 MED ORDER — ATROPINE SULFATE 1 MG/10ML IJ SOSY: 0.5000 mg | PREFILLED_SYRINGE | Freq: Once | INTRAMUSCULAR | Status: DC

## 2019-04-20 MED ORDER — DOPAMINE-DEXTROSE 3.2-5 MG/ML-% IV SOLN
0.0000 ug/kg/min | INTRAVENOUS | Status: DC
  Filled 2019-04-20: qty 250

## 2019-04-20 MED ORDER — EPINEPHRINE HCL 5 MG/250ML IV SOLN IN NS
0.5000 ug/min | Freq: Once | INTRAVENOUS | Status: DC
  Filled 2019-04-20: qty 250

## 2019-04-20 MED ORDER — SODIUM BICARBONATE 8.4 % IV SOLN
INTRAVENOUS | Status: AC
  Filled 2019-04-20: qty 50

## 2019-04-21 LAB — CULTURE, BLOOD (ROUTINE X 2)
Culture: NO GROWTH
Culture: NO GROWTH
Special Requests: ADEQUATE
Special Requests: ADEQUATE

## 2019-04-22 MED FILL — Medication: Qty: 1 | Status: CN

## 2019-04-22 MED FILL — Medication: Qty: 2 | Status: AC

## 2019-05-07 NOTE — Death Summary Note (Signed)
DEATH SUMMARY   Patient Details  Name: Diana Brady MRN: [id number] DOB: [birth year]  Admission/Discharge Information   Admit Date:  2019-05-01  Date of Death: Date of Death: 05-05-19  Time of Death: Time of Death: 0102  Length of Stay: 4  Referring Physician: Galvin Proffer, MD   Reason(s) for Hospitalization  Acute respiratory failure due to COVID-19 pneumonia.  Diagnoses  Preliminary cause of death:  Secondary Diagnoses (including complications and co-morbidities):  Principal Problem:   Acute respiratory distress syndrome (ARDS) due to COVID-19 virus [facility]) Active Problems:   Type 2 diabetes mellitus with complication, without long-term current use of insulin (HCC)   Hypertension   Hyperlipidemia   Atrial fibrillation (HCC)   Acute on chronic respiratory failure with hypoxia (HCC)   Physical debility   Current use of long term anticoagulation   Brief Hospital Course (including significant findings, care, treatment, and services provided and events leading to death)  CEIRRA BELLI is a 79 y.o. year old female who has history of COPD and she is on 2 L oxygen at home, diabetes mellitus on insulin and chronic debilitated status from assisted living facility who was brought to the [facility] emergency room after testing positive for COVID-19 on 04/11/2019 with increasing shortness of breath and fatigue.  Patient was complaining of not feeling well and generalized weakness.  In the emergency room, she was needing about 4 to 6 L of oxygen, chest x-ray shows bilateral infiltrates and interstitial edema.  Patient was transferred to [facility] and admitted and treated for COVID-19 related pneumonia.  Patient was in the hospital and treated with aggressive therapies directed to COVID-19 pneumonia with oxygen support, dexamethasone, remdesivir, convalescent plasma and antibiotics.  She had other multiple comorbidities including diabetes that she was treated with insulin, she  also has chronic A. fib that she was treated with Pradaxa.  Patient had fluctuating oxygen requirement, remained critically ill.  While she was in active treatment, patient had sudden cardiac arrest and she was given a prolonged CPR including medications and mechanical ventilation along with vasopressor support.  After approximately 60 minutes of resuscitation with no useful response and no return of spontaneous circulation, patient was declared dead at 0102 on 05/05/19.  Family notified.    Pertinent Labs and Studies  Significant Diagnostic Studies DG CHEST PORT 1 VIEW  Result Date: 04/18/2019 CLINICAL DATA:  Status post PICC line placement EXAM: PORTABLE CHEST 1 VIEW COMPARISON:  04/17/2019 FINDINGS: Cardiac shadow is stable. New right-sided PICC line is noted with the catheter tip at the cavoatrial junction. The hazy lung opacities have improved somewhat in the interval from the prior exam although persistent bibasilar opacities remain. No bony abnormality is noted. IMPRESSION: Improved aeration as described. New right-sided PICC line in satisfactory position. Electronically Signed   By: Alcide Clever M.D.   On: 04/18/2019 23:34   DG CHEST PORT 1 VIEW  Result Date: 04/17/2019 CLINICAL DATA:  Follow-up pneumonia. EXAM: PORTABLE CHEST 1 VIEW COMPARISON:  04/30/18. FINDINGS: Hazy bilateral airspace lung opacities are noted, similar to prior exams allowing for differences in radiographic technique and patient positioning. No convincing new lung abnormalities. No visualized pleural effusion.  No pneumothorax. IMPRESSION: 1. Bilateral hazy airspace lung opacities consistent with multifocal pneumonia. Findings are compatible with COVID-19 pneumonia. No significant change from most recent prior exam. Electronically Signed   By: Amie Portland M.D.   On: 04/17/2019 13:27   DG CHEST PORT 1 VIEW  Result Date: 05/01/19 CLINICAL  DATA:  Pneumonia. EXAM: PORTABLE CHEST 1 VIEW COMPARISON:  April 15, 2019. FINDINGS: Stable cardiomegaly. No pneumothorax is noted. Stable bilateral lung opacities are noted consistent with multifocal pneumonia. Bony thorax is unremarkable. IMPRESSION: Stable bilateral lung opacities are noted consistent with multifocal pneumonia. Continued radiographic follow-up is recommended. Electronically Signed   By: Marijo Conception M.D.   On: 2019-05-16 10:47   [place] EKG SITE RITE  Result Date: 04/18/2019 If Site Rite image not attached, placement could not be confirmed due to current cardiac rhythm.   Microbiology Recent Results (from the past 240 hour(s))  Culture, blood (Routine X 2) w Reflex to ID Panel     Status: None (Preliminary result)   Collection Time: 05/16/19  7:25 AM   Specimen: BLOOD  Result Value Ref Range Status   Specimen Description   Final    BLOOD RIGHT ASSIST CONTROL Performed at [facility], [place] [address], [place], [place] [address]    Special Requests   Final    BOTTLES DRAWN AEROBIC ONLY Blood Culture adequate volume Performed at [place] [address] [place], [place] [address]    Culture   Final    NO GROWTH 3 DAYS Performed at [facility], [place] [address], [place], [place] [address]    Report Status PENDING  Incomplete  Culture, blood (Routine X 2) w Reflex to ID Panel     Status: None (Preliminary result)   Collection Time: 05/16/2019  7:32 AM   Specimen: BLOOD  Result Value Ref Range Status   Specimen Description   Final    BLOOD RIGHT ARM Performed at [facility] [address] [place], [place] [address]    Special Requests   Final    BOTTLES DRAWN AEROBIC ONLY Blood Culture adequate volume Performed at [place] [address] [place], [place] [address]    Culture   Final    NO GROWTH 3 DAYS Performed at [facility], [place] [address], [place],  [address]    Report Status PENDING  Incomplete    Lab Basic  Metabolic Panel: Recent Labs  Lab 04/17/19 0159 04/17/19 1725 04/18/19 0303 04/19/19 0325 05/05/2019 0030 04/11/2019 0044  NA 138 136 139 138 149* 142  K 4.0 4.2 3.7 4.0 6.6* 4.8  CL 101 101 104 101 96*  --   CO2 25 23 25 25 29   --   GLUCOSE 286* 612* 192* 139* 521*  --   BUN 31* 33* 34* 39* 44*  --   CREATININE 0.92 1.00 0.73 0.87 1.06*  --   CALCIUM 8.4* 8.4* 8.8* 8.4* 8.0*  --   MG 1.9  --  1.8 1.7  --   --   PHOS 2.6  --  1.9* 3.9  --   --    Liver Function Tests: Recent Labs  Lab 2019/05/16 0732 04/17/19 0159 04/18/19 0303 04/19/19 0325  AST 24 22 25 21   ALT 14 12 14 14   ALKPHOS 63 62 68 64  BILITOT 1.2 1.0 0.9 0.7  PROT 6.1* 6.2* 6.0* 5.4*  ALBUMIN 2.8* 2.7* 2.9* 2.6*   No results for input(s): LIPASE, AMYLASE in the last 168 hours. No results for input(s): AMMONIA in the last 168 hours. CBC: Recent Labs  Lab May 16, 2019 0732 04/17/19 0159 04/18/19 0303 04/19/19 0325 04/24/2019 0030 04/19/2019 0044  WBC 9.4 8.0 15.2* 14.2* 24.2*  --   NEUTROABS 8.7* 7.3 13.5* 12.1*  14.9*  --   HGB 9.7* 10.0* 9.9* 9.8* 9.6* 8.8*  HCT 31.8* 33.2* 32.7* 32.8* 33.3* 26.0*  MCV 81.7 83.2 84.9 84.5 88.8  --   PLT 133* 135* 163 207 199  --    Cardiac Enzymes: No results for input(s): CKTOTAL, CKMB, CKMBINDEX, TROPONINI in the last 168 hours. Sepsis Labs: Recent Labs  Lab 04/26/2019 0732 04/17/19 0159 04/18/19 0303 04/19/19 0325 2019-01-04 0030  PROCALCITON 0.17  --   --   --   --   WBC 9.4 8.0 15.2* 14.2* 24.2*    Procedures/Operations  Mechanical ventilation, CPR   Corinna Burkman 2020-12-3118, 8:01 AM

## 2019-05-07 NOTE — Progress Notes (Signed)
Spoke with son. Updates given. Per son "will call back to ICU for updates"

## 2019-05-07 NOTE — Progress Notes (Signed)
Called son and left message on mobile to please call re: mother. Also called home number with no option to leave a message. Phone just keeps ringing.

## 2019-05-07 NOTE — Progress Notes (Signed)
TOD 0102. Called by MD Edwyna Ready RN and Migdalia Dk RN.  Patient's son called and notified.  Post mortem care completed Death certificate filled out

## 2019-05-07 NOTE — Progress Notes (Addendum)
eLink Physician-Brief Progress Note Patient Name: Diana Brady DOB: 1940/08/01 MRN: [id number]   Date of Service  05/01/2019  HPI/Events of Note  Pt admitted 3 days ago with COVID 19 pneumonia, developed acute respiratory failure this even9ing requiring intubation, Pt coded on the floor and was transferred to [facility] where she coded again, she had a prolonged resuscitationtotaling 1 hour with brief ROSC on two occasions but was ultimately pronounced dead  After re-arresting and failing to achieve ROSC.  eICU Interventions  Management of Code [facility]. I did call and speak with her son during the course of the [facility], I made him aware of the grave prognosis, given the recurrent arrests and prolonged resuscitation.        Kerry Kass Linsay Vogt 04/19/2019, 1:37 AM

## 2019-05-07 NOTE — Progress Notes (Signed)
[facility] called around midnight patient found hypoxic on her oxygen off.  Per RN on floor only hypoxic for a minute or so before found unresponsive.  Patient became pulseless unresponsive CPR was immediately initiated.  Please see [facility] records for full details patient was coded for approximately 60 minutes on and off.  After about 25 minutes was transferred to the ICU where PCCM started to aid in the code.  She was given multiple rounds of epi epi drip had been started along with Levophed drip and dopamine.  Pulse was obtained several times during the code but did not last long.  After approximately 60 minutes of coding patient it was decided to stop as there is no useful response and significant return of spontaneous circulation within that time.  Family called by PCCM and updated.  Death certificate filled out.  Cc time 60 minutes

## 2019-05-07 DEATH — deceased
